# Patient Record
Sex: Male | Born: 1980
Health system: Southern US, Community
[De-identification: ages and names within clinical notes are randomized; demographics above are authoritative.]

## PROBLEM LIST (undated history)

## (undated) DIAGNOSIS — I10 Essential (primary) hypertension: Secondary | ICD-10-CM

## (undated) DIAGNOSIS — E785 Hyperlipidemia, unspecified: Secondary | ICD-10-CM

## (undated) DIAGNOSIS — R7989 Other specified abnormal findings of blood chemistry: Secondary | ICD-10-CM

## (undated) DIAGNOSIS — K429 Umbilical hernia without obstruction or gangrene: Secondary | ICD-10-CM

## (undated) DIAGNOSIS — E8881 Metabolic syndrome: Secondary | ICD-10-CM

## (undated) DIAGNOSIS — E119 Type 2 diabetes mellitus without complications: Secondary | ICD-10-CM

## (undated) HISTORY — PX: NO PAST SURGERIES: SHX2092

## (undated) HISTORY — PX: KNEE SURGERY: SHX244

## (undated) HISTORY — DX: Umbilical hernia without obstruction or gangrene: K42.9

## (undated) HISTORY — DX: Hyperlipidemia, unspecified: E78.5

## (undated) HISTORY — DX: Other specified abnormal findings of blood chemistry: R79.89

## (undated) HISTORY — DX: Metabolic syndrome: E88.81

## (undated) HISTORY — DX: Essential (primary) hypertension: I10

## (undated) HISTORY — DX: Type 2 diabetes mellitus without complications: E11.9

---

## 1999-06-09 ENCOUNTER — Encounter: Payer: Self-pay | Admitting: Family Medicine

## 1999-06-09 ENCOUNTER — Encounter: Admission: RE | Admit: 1999-06-09 | Discharge: 1999-06-09 | Payer: Self-pay | Admitting: Family Medicine

## 2010-05-30 ENCOUNTER — Emergency Department (HOSPITAL_COMMUNITY)
Admission: EM | Admit: 2010-05-30 | Discharge: 2010-05-30 | Payer: Self-pay | Source: Home / Self Care | Admitting: Emergency Medicine

## 2010-05-30 LAB — POCT I-STAT, CHEM 8
BUN: 10 mg/dL (ref 6–23)
Calcium, Ion: 1.05 mmol/L — ABNORMAL LOW (ref 1.12–1.32)
Chloride: 107 mEq/L (ref 96–112)
Creatinine, Ser: 1.3 mg/dL (ref 0.4–1.5)
Glucose, Bld: 117 mg/dL — ABNORMAL HIGH (ref 70–99)
HCT: 52 % (ref 39.0–52.0)
Hemoglobin: 17.7 g/dL — ABNORMAL HIGH (ref 13.0–17.0)
Potassium: 4.3 mEq/L (ref 3.5–5.1)
Sodium: 143 mEq/L (ref 135–145)
TCO2: 28 mmol/L (ref 0–100)

## 2010-07-01 DIAGNOSIS — Z23 Encounter for immunization: Secondary | ICD-10-CM

## 2010-07-01 DIAGNOSIS — Z0000 Encounter for general adult medical examination without abnormal findings: Secondary | ICD-10-CM

## 2010-07-01 DIAGNOSIS — I10 Essential (primary) hypertension: Secondary | ICD-10-CM

## 2010-07-22 ENCOUNTER — Encounter: Payer: Self-pay | Admitting: Internal Medicine

## 2010-08-05 ENCOUNTER — Ambulatory Visit (INDEPENDENT_AMBULATORY_CARE_PROVIDER_SITE_OTHER): Payer: BC Managed Care – HMO | Admitting: Internal Medicine

## 2010-08-05 DIAGNOSIS — I10 Essential (primary) hypertension: Secondary | ICD-10-CM

## 2010-09-10 ENCOUNTER — Other Ambulatory Visit: Payer: BC Managed Care – HMO | Admitting: Internal Medicine

## 2010-09-10 ENCOUNTER — Other Ambulatory Visit: Payer: BC Managed Care – HMO

## 2010-09-10 DIAGNOSIS — I10 Essential (primary) hypertension: Secondary | ICD-10-CM

## 2010-09-10 LAB — BASIC METABOLIC PANEL
BUN: 15 mg/dL (ref 6–23)
CO2: 23 mEq/L (ref 19–32)
Calcium: 9.2 mg/dL (ref 8.4–10.5)
Chloride: 104 mEq/L (ref 96–112)
Creat: 1.11 mg/dL (ref 0.40–1.50)
Glucose, Bld: 91 mg/dL (ref 70–99)
Potassium: 3.8 mEq/L (ref 3.5–5.3)
Sodium: 140 mEq/L (ref 135–145)

## 2011-02-08 ENCOUNTER — Ambulatory Visit: Payer: BC Managed Care – HMO | Admitting: Internal Medicine

## 2011-03-10 ENCOUNTER — Encounter: Payer: Self-pay | Admitting: Internal Medicine

## 2011-03-10 ENCOUNTER — Other Ambulatory Visit: Payer: Self-pay | Admitting: Internal Medicine

## 2011-03-10 ENCOUNTER — Ambulatory Visit (INDEPENDENT_AMBULATORY_CARE_PROVIDER_SITE_OTHER): Payer: BC Managed Care – HMO | Admitting: Internal Medicine

## 2011-03-10 VITALS — BP 136/90 | HR 68 | Temp 97.8°F | Ht 68.0 in | Wt 254.0 lb

## 2011-03-10 DIAGNOSIS — Z23 Encounter for immunization: Secondary | ICD-10-CM

## 2011-03-10 DIAGNOSIS — I10 Essential (primary) hypertension: Secondary | ICD-10-CM

## 2011-03-10 DIAGNOSIS — E785 Hyperlipidemia, unspecified: Secondary | ICD-10-CM

## 2011-03-10 HISTORY — DX: Essential (primary) hypertension: I10

## 2011-03-10 LAB — BASIC METABOLIC PANEL
BUN: 10 mg/dL (ref 6–23)
CO2: 27 mEq/L (ref 19–32)
Calcium: 9 mg/dL (ref 8.4–10.5)
Chloride: 104 mEq/L (ref 96–112)
Creat: 1.12 mg/dL (ref 0.50–1.35)
Glucose, Bld: 99 mg/dL (ref 70–99)
Potassium: 3.9 mEq/L (ref 3.5–5.3)
Sodium: 140 mEq/L (ref 135–145)

## 2011-03-10 LAB — LIPID PANEL
Cholesterol: 240 mg/dL — ABNORMAL HIGH (ref 0–200)
HDL: 38 mg/dL — ABNORMAL LOW (ref >39)
LDL Cholesterol: 189 mg/dL — ABNORMAL HIGH (ref 0–99)
Total CHOL/HDL Ratio: 6.3 Ratio
Triglycerides: 67 mg/dL (ref <150)
VLDL: 13 mg/dL (ref 0–40)

## 2011-03-10 NOTE — Patient Instructions (Addendum)
Add amlodipine to lisinopril 20/25 daily for hypertension control. Return in 4 weeks. Influenza immunization given today.

## 2011-03-10 NOTE — Progress Notes (Signed)
  Subjective:    Patient ID: Charles Higgins, male    DOB: [birth year], 30 y.o.   MRN: [id number]  HPI 30 year old black male with family history of hypertension currently on lisinopril 20/25 daily. Here today for six-month followup. Blood pressure is elevated at 140/90 with large cuff. Has not been checking blood pressure. Is somewhat overweight. Is compliant with blood pressure medication.    Review of Systems     Objective:   Physical Exam neck supple without JVD or thyromegaly; chest clear; cardiac exam regular rate and rhythm; extremities without edema. Basic metabolic panel drawn and is pending.        Assessment & Plan:  Hypertension  Obesity  Plan: Add amlodipine 5 mg daily 2 lisinopril 20/25 daily. Return in 4 weeks. Influenza immunization given today. Is expecting newborn child in approximately 22 weeks.

## 2011-03-11 ENCOUNTER — Other Ambulatory Visit: Payer: Self-pay

## 2011-03-11 MED ORDER — SIMVASTATIN 20 MG PO TABS: 20.0000 mg | ORAL_TABLET | Freq: Every evening | ORAL | Status: DC

## 2011-03-15 ENCOUNTER — Other Ambulatory Visit: Payer: Self-pay

## 2011-03-15 MED ORDER — LISINOPRIL-HYDROCHLOROTHIAZIDE 20-25 MG PO TABS: 1.0000 | ORAL_TABLET | Freq: Every day | ORAL | Status: DC

## 2011-04-12 ENCOUNTER — Ambulatory Visit: Payer: BC Managed Care – HMO | Admitting: Internal Medicine

## 2011-04-21 ENCOUNTER — Ambulatory Visit (INDEPENDENT_AMBULATORY_CARE_PROVIDER_SITE_OTHER): Payer: BC Managed Care – HMO | Admitting: Internal Medicine

## 2011-04-21 ENCOUNTER — Encounter: Payer: Self-pay | Admitting: Internal Medicine

## 2011-04-21 VITALS — BP 132/84 | HR 76 | Temp 98.1°F | Wt 251.0 lb

## 2011-04-21 DIAGNOSIS — I10 Essential (primary) hypertension: Secondary | ICD-10-CM

## 2011-04-21 NOTE — Patient Instructions (Signed)
You have been given prescription for home blood pressure monitor. Please check her blood pressure at home 2 or 3 times a week. Please advise if blood pressure is not well controlled i.e. staying 120-134 over no higher than 84 diastolically. Please try the diet exercise and lose some weight. Return in 6 months. Continue same medications for now.

## 2011-04-21 NOTE — Progress Notes (Signed)
  Subjective:    Patient ID: Charles Higgins, male    DOB: [birth year], 30 y.o.   MRN: [id number]  HPI 30 year old black male with hypertension. Last visit we added amlodipine 5 mg daily to lisinopril 20/25 daily. He had influenza immunization at last visit. No complaints or problems on the medication. No side effects. Expecting first born child April 2013 which is been determined to be a boy.    Review of Systems     Objective:   Physical Exam blood pressure recheck today with large is 120/88. Chest clear to auscultation; cardiac exam regular rate and rhythm; extremities without edema        Assessment & Plan:  Hypertension  Plan: Patient will consider purchasing a home blood pressure monitor and monitor his blood pressure at home 2 or 3 times a week. Will advise if his blood pressure is elevated higher than 134 systolically or 84 diastolically on a consistent basis. Otherwise continue same medication return in 6 months. Aiming to have systolic blood pressure 120 to 130 most of the time.

## 2011-06-23 ENCOUNTER — Ambulatory Visit (INDEPENDENT_AMBULATORY_CARE_PROVIDER_SITE_OTHER): Payer: BC Managed Care – HMO | Admitting: Internal Medicine

## 2011-06-23 ENCOUNTER — Ambulatory Visit: Payer: BC Managed Care – HMO | Admitting: Internal Medicine

## 2011-06-23 ENCOUNTER — Encounter: Payer: Self-pay | Admitting: Internal Medicine

## 2011-06-23 DIAGNOSIS — K429 Umbilical hernia without obstruction or gangrene: Secondary | ICD-10-CM

## 2011-06-23 DIAGNOSIS — E785 Hyperlipidemia, unspecified: Secondary | ICD-10-CM | POA: Insufficient documentation

## 2011-06-23 DIAGNOSIS — I10 Essential (primary) hypertension: Secondary | ICD-10-CM

## 2011-06-23 HISTORY — DX: Umbilical hernia without obstruction or gangrene: K42.9

## 2011-06-23 HISTORY — DX: Hyperlipidemia, unspecified: E78.5

## 2011-06-23 NOTE — Progress Notes (Signed)
  Subjective:    Patient ID: Charles Higgins, male    DOB: August 25, 1980, 31 y.o.   MRN: [id number]  HPI patient in today concerned about area in umbilicus it he just noted about 2 weeks ago in the shower. Noticed a bald in the umbilicus. Doesn't recall any heavy lifting or exertion. First child is coming in April and he is excited about that. Unfortunately his blood pressure is not well controlled today. He's been out of his medications for 2 weeks because of financial reasons. We called [facility] try to clarify the cost of some of his medications. He's also not been taking Zocor generic either. History of hypertension and hyperlipidemia. Says he's able to purchase medications tomorrow.    Review of Systems     Objective:   Physical Exam  umbilicus has small herniation lateral aspect. It is reducible.        Assessment & Plan:  Umbilical hernia  Hypertension-poorly controlled at present off medication  Hyperlipidemia  Plan: Spent some time speaking with patient about cause of umbilical hernia, complications of strangulation, et Karie Soda. He does not have to have this fixed at the present time but may want to consider having it fixed down the road in the future. He was reassured this is not a serious problem and it is a common problem. I am concerned he is not on his antihypertensive medications. He assures me he will start taking that tomorrow. BP was 132/84 in December. Needs to take BP meds daily.

## 2011-06-23 NOTE — Patient Instructions (Signed)
Please restart her antihypertensive medications and your lipid-lowering medicine. Please be sure that the umbilical hernia is not an urgent situation. He can be repaired at your convenience. Please watch for signs of possible strangulation of the umbilical hernia. Avoid heavy lifting.

## 2011-10-25 ENCOUNTER — Ambulatory Visit: Payer: BC Managed Care – HMO | Admitting: Internal Medicine

## 2011-11-28 ENCOUNTER — Other Ambulatory Visit: Payer: Self-pay | Admitting: Internal Medicine

## 2012-07-15 ENCOUNTER — Other Ambulatory Visit: Payer: Self-pay | Admitting: Internal Medicine

## 2012-07-15 NOTE — Telephone Encounter (Signed)
Does pt need appt.

## 2012-07-16 ENCOUNTER — Other Ambulatory Visit: Payer: Self-pay

## 2012-07-16 NOTE — Telephone Encounter (Signed)
Patient last seen 06/23/2011. Cancelled 2 appointments after that

## 2013-07-14 ENCOUNTER — Other Ambulatory Visit: Payer: Self-pay | Admitting: Internal Medicine

## 2013-07-14 NOTE — Telephone Encounter (Signed)
May need appt

## 2013-07-15 NOTE — Telephone Encounter (Signed)
Patient admits to being out of medicine for about 6 months. Has not been seen here in 2 years. Appointment made for Thursday 07/18/2013. meds filled for 30 days only.

## 2013-07-16 ENCOUNTER — Other Ambulatory Visit: Payer: Self-pay | Admitting: Internal Medicine

## 2013-07-18 ENCOUNTER — Ambulatory Visit (INDEPENDENT_AMBULATORY_CARE_PROVIDER_SITE_OTHER): Payer: BC Managed Care – PPO | Admitting: Internal Medicine

## 2013-07-18 ENCOUNTER — Encounter: Payer: Self-pay | Admitting: Internal Medicine

## 2013-07-18 VITALS — BP 180/130 | HR 84 | Temp 98.3°F | Wt 263.0 lb

## 2013-07-18 DIAGNOSIS — E119 Type 2 diabetes mellitus without complications: Secondary | ICD-10-CM

## 2013-07-18 DIAGNOSIS — I10 Essential (primary) hypertension: Secondary | ICD-10-CM

## 2013-07-18 DIAGNOSIS — E669 Obesity, unspecified: Secondary | ICD-10-CM

## 2013-07-18 LAB — POCT URINALYSIS DIPSTICK
BILIRUBIN UA: NEGATIVE
Blood, UA: NEGATIVE
GLUCOSE UA: NEGATIVE
Ketones, UA: NEGATIVE
Leukocytes, UA: NEGATIVE
Nitrite, UA: NEGATIVE
Urobilinogen, UA: NEGATIVE
pH, UA: 6

## 2013-07-18 LAB — HEMOGLOBIN A1C
Hgb A1c MFr Bld: 6.4 % — ABNORMAL HIGH (ref <5.7)
MEAN PLASMA GLUCOSE: 137 mg/dL — AB (ref <117)

## 2013-07-18 MED ORDER — AMLODIPINE BESYLATE 5 MG PO TABS: ORAL_TABLET | ORAL | Status: DC

## 2013-07-18 MED ORDER — LISINOPRIL-HYDROCHLOROTHIAZIDE 20-25 MG PO TABS: ORAL_TABLET | ORAL | Status: DC

## 2013-07-18 NOTE — Patient Instructions (Signed)
Restart blood pressure meds and return in 10 days

## 2013-07-19 ENCOUNTER — Other Ambulatory Visit: Payer: Self-pay

## 2013-07-19 DIAGNOSIS — E119 Type 2 diabetes mellitus without complications: Secondary | ICD-10-CM

## 2013-07-19 LAB — BASIC METABOLIC PANEL
BUN: 14 mg/dL (ref 6–23)
CHLORIDE: 102 meq/L (ref 96–112)
CO2: 30 meq/L (ref 19–32)
Calcium: 8.8 mg/dL (ref 8.4–10.5)
Creat: 1.35 mg/dL (ref 0.50–1.35)
Glucose, Bld: 88 mg/dL (ref 70–99)
POTASSIUM: 3.6 meq/L (ref 3.5–5.3)
SODIUM: 141 meq/L (ref 135–145)

## 2013-07-19 MED ORDER — METFORMIN HCL 500 MG PO TABS: 500.0000 mg | ORAL_TABLET | Freq: Two times a day (BID) | ORAL | Status: DC

## 2013-07-19 NOTE — Progress Notes (Signed)
Patient informed. 

## 2013-07-29 ENCOUNTER — Ambulatory Visit (INDEPENDENT_AMBULATORY_CARE_PROVIDER_SITE_OTHER): Payer: BC Managed Care – HMO | Admitting: Internal Medicine

## 2013-07-29 ENCOUNTER — Encounter: Payer: Self-pay | Admitting: Internal Medicine

## 2013-07-29 VITALS — BP 150/110 | HR 92 | Temp 98.2°F | Wt 260.0 lb

## 2013-07-29 DIAGNOSIS — E119 Type 2 diabetes mellitus without complications: Secondary | ICD-10-CM

## 2013-07-29 DIAGNOSIS — I10 Essential (primary) hypertension: Secondary | ICD-10-CM

## 2013-07-29 MED ORDER — LISINOPRIL 20 MG PO TABS: 20.0000 mg | ORAL_TABLET | Freq: Every day | ORAL | Status: DC

## 2013-07-29 MED ORDER — FUROSEMIDE 40 MG PO TABS: 40.0000 mg | ORAL_TABLET | Freq: Every day | ORAL | Status: DC

## 2013-07-29 NOTE — Patient Instructions (Signed)
Take lisinopril 20 mg daily, take Lasix 40 mg daily, take Accu-Cheks twice daily before breakfast and supper. Continue amlodipine 5 mg daily. Follow 1800-calorie ADA diet. Keep track of her blood sugars. Return in 2 weeks.

## 2013-07-29 NOTE — Progress Notes (Signed)
   Subjective:    Patient ID: Charles Higgins, male    DOB: 07/01/1980, 33 y.o.   MRN: [id number]  HPI    He  has been taking blood pressure medication lisinopril/HCTZ 20/25 daily in addition to amlodipine 5 mg daily. Blood pressure is a bit better but not a lot better. We also have learned that he now has diabetes mellitus. His father has history of diabetes and is on dialysis. Spoke with him at length about 1800-calorie diet, foods it would be acceptable and discussion of portions. He needs to get some exercise on a daily basis. I think will let him try diet for few weeks and recheck his hemoglobin A1c in 6 weeks. Do not think lisinopril HCTZ is working for him.    Review of Systems     Objective:   Physical Exam  Recheck blood pressure in right arm. Left arm was checked the first time. Blood pressure in right arm is 160/100. Chest clear. Cardiac exam regular rate and rhythm. Extremities without edema.      Assessment & Plan:  Poorly controlled hypertension  Serum creatinine at upper limits of normal  Diabetes mellitus  Plan: He is to return here in 2 weeks. To discontinue lisinopril HCTZ. He will continue lisinopril 20 mg daily and start Lasix 40 mg daily. He will continue amlodipine 5 mg daily. He'll purchase diabetic test strips to use twice daily before breakfast and supper and bring readings at next visit.

## 2013-08-16 ENCOUNTER — Ambulatory Visit: Payer: BC Managed Care – HMO | Admitting: Internal Medicine

## 2013-08-23 ENCOUNTER — Ambulatory Visit (INDEPENDENT_AMBULATORY_CARE_PROVIDER_SITE_OTHER): Payer: BC Managed Care – PPO | Admitting: Internal Medicine

## 2013-08-23 ENCOUNTER — Encounter: Payer: Self-pay | Admitting: Internal Medicine

## 2013-08-23 VITALS — BP 130/100 | HR 80 | Temp 98.0°F | Wt 253.0 lb

## 2013-08-23 DIAGNOSIS — E119 Type 2 diabetes mellitus without complications: Secondary | ICD-10-CM

## 2013-08-23 DIAGNOSIS — I10 Essential (primary) hypertension: Secondary | ICD-10-CM

## 2013-08-23 DIAGNOSIS — R7989 Other specified abnormal findings of blood chemistry: Secondary | ICD-10-CM

## 2013-08-23 DIAGNOSIS — R799 Abnormal finding of blood chemistry, unspecified: Secondary | ICD-10-CM

## 2013-08-23 LAB — GLUCOSE, POCT (MANUAL RESULT ENTRY)

## 2013-08-23 MED ORDER — AMLODIPINE BESYLATE 10 MG PO TABS: ORAL_TABLET | ORAL | Status: DC

## 2013-08-23 NOTE — Patient Instructions (Addendum)
Return in one week. Increase amlodipine to 10 mg daily. Continue metformin.

## 2013-08-24 LAB — BASIC METABOLIC PANEL
BUN: 11 mg/dL (ref 6–23)
CHLORIDE: 101 meq/L (ref 96–112)
CO2: 27 meq/L (ref 19–32)
CREATININE: 1.1 mg/dL (ref 0.50–1.35)
Calcium: 8.9 mg/dL (ref 8.4–10.5)
GLUCOSE: 76 mg/dL (ref 70–99)
Potassium: 3.8 mEq/L (ref 3.5–5.3)
Sodium: 141 mEq/L (ref 135–145)

## 2013-08-25 DIAGNOSIS — R7989 Other specified abnormal findings of blood chemistry: Secondary | ICD-10-CM

## 2013-08-25 DIAGNOSIS — E119 Type 2 diabetes mellitus without complications: Secondary | ICD-10-CM | POA: Insufficient documentation

## 2013-08-25 HISTORY — DX: Type 2 diabetes mellitus without complications: E11.9

## 2013-08-25 HISTORY — DX: Other specified abnormal findings of blood chemistry: R79.89

## 2013-08-25 NOTE — Progress Notes (Signed)
   Subjective:    Patient ID: Charles Higgins, male    DOB: 02/18/1981, 33 y.o.   MRN: [id number]  HPI Blood pressure remains elevated. Says he has been taking medications as directed. Unfortunately did not purchase home glucose monitor because he thought it was very expensive. Has lost prescription. New prescription given for home glucose monitoring test strips today. Says he's make considerable modifications in his diet. Has lost a few pounds. Basic metabolic panel drawn today. He is on Lasix 40 mg daily, lisinopril 20 mg daily, amlodipine 5 mg daily.    Review of Systems     Objective:   Physical Exam chest clear to auscultation. Cardiac exam regular rate and rhythm. Extremities without edema.        Assessment & Plan:  Poorly controlled hypertension-see below  Type 2 diabetes mellitus-purchase diabetic test strips and home glucose monitor. Continue metformin 500 mg twice daily  Accu-Chek today is 89.  Plan: He is to return in one week for office visit blood pressure check. Finances are a problem for him. Increase amlodipine to 10 mg daily. Continue Lasix and lisinopril.

## 2013-08-27 ENCOUNTER — Ambulatory Visit: Payer: BC Managed Care – HMO

## 2013-08-30 ENCOUNTER — Ambulatory Visit: Payer: BC Managed Care – PPO | Admitting: Internal Medicine

## 2013-08-30 ENCOUNTER — Encounter: Payer: Self-pay | Admitting: Internal Medicine

## 2013-08-30 VITALS — BP 154/104 | HR 76 | Temp 97.7°F | Wt 253.0 lb

## 2013-08-30 DIAGNOSIS — E119 Type 2 diabetes mellitus without complications: Secondary | ICD-10-CM

## 2013-08-30 DIAGNOSIS — I10 Essential (primary) hypertension: Secondary | ICD-10-CM

## 2013-08-30 MED ORDER — LOSARTAN POTASSIUM 100 MG PO TABS: 100.0000 mg | ORAL_TABLET | Freq: Every day | ORAL | Status: DC

## 2013-08-30 NOTE — Progress Notes (Signed)
   Subjective:    Patient ID: Charles Higgins, male    DOB: [birth year], 33 y.o.   MRN: [id number]  HPI In today for recheck of hypertension. Denies noncompliance with medication. He is on lisinopril 20 mg daily, Lasix 40 mg daily, amlodipine has been increased to 10 mg daily. He has no edema on increased dose of amlodipine. Has not purchased Accu-Chek machine yet because he could not afford it. He just got paid today.    Review of Systems     Objective:   Physical Exam Skin is warm and dry. Chest clear to auscultation. Cardiac exam regular rate and rhythm. Extremities without edema       Assessment & Plan:  Difficult to control hypertension currently on 3 drug regimen  New onset diabetes mellitus currently being controlled by diet that is yet purchase Accu-Chek she  Plan: Discontinue lisinopril and substitute losartan 100 mg daily, continue Lasix 40 mg daily, continue amlodipine 10 mg daily. Call me with blood pressure readings from pharmacy in one week, otherwise return here in 2 weeks. Consider adding Bystolic.

## 2013-08-30 NOTE — Patient Instructions (Signed)
Please purchase Accu-Chek machine and check Accu-Cheks at least once daily. Check blood pressure next week at pharmacy and return here in 2 weeks. Call me with blood pressure results next week. We are substituting losartan for lisinopril.

## 2013-09-03 ENCOUNTER — Ambulatory Visit: Payer: BC Managed Care – HMO

## 2013-09-13 ENCOUNTER — Ambulatory Visit: Payer: BC Managed Care – PPO | Admitting: Internal Medicine

## 2013-09-19 ENCOUNTER — Encounter: Payer: Self-pay | Admitting: Internal Medicine

## 2013-09-19 ENCOUNTER — Ambulatory Visit (INDEPENDENT_AMBULATORY_CARE_PROVIDER_SITE_OTHER): Payer: BC Managed Care – PPO | Admitting: Internal Medicine

## 2013-09-19 VITALS — BP 140/98 | HR 76 | Temp 97.6°F | Wt 259.0 lb

## 2013-09-19 DIAGNOSIS — I10 Essential (primary) hypertension: Secondary | ICD-10-CM

## 2013-09-19 DIAGNOSIS — E119 Type 2 diabetes mellitus without complications: Secondary | ICD-10-CM

## 2013-09-19 LAB — BASIC METABOLIC PANEL
BUN: 10 mg/dL (ref 6–23)
CALCIUM: 9.1 mg/dL (ref 8.4–10.5)
CO2: 29 mEq/L (ref 19–32)
Chloride: 102 mEq/L (ref 96–112)
Creat: 1.11 mg/dL (ref 0.50–1.35)
Glucose, Bld: 98 mg/dL (ref 70–99)
POTASSIUM: 3.8 meq/L (ref 3.5–5.3)
Sodium: 140 mEq/L (ref 135–145)

## 2013-09-19 MED ORDER — LABETALOL HCL 200 MG PO TABS: 200.0000 mg | ORAL_TABLET | Freq: Two times a day (BID) | ORAL | Status: DC

## 2013-09-19 NOTE — Progress Notes (Signed)
   Subjective:    Patient ID: Charles Higgins, male    DOB: Jun 25, 1980, 33 y.o.   MRN: [id number]  HPI In today to followup on diabetes and hypertension. Says that his Accu-Cheks have been running no higher than 92. Hemoglobin A1c drawn today. Basic metabolic panel also drawn since she's now on Lasix and losartan 100 mg daily. He's also on amlodipine 10 mg daily. He is not having dependent edema with amlodipine. Says he is watching his diet. I don't think is getting much exercise.  Urine sent for microalbumin today.  I had placed him on metformin previously but he is not taking it.  Review of Systems     Objective:   Physical Exam  Spent 10 minutes speaking with patient about these issues. Blood pressure still not well controlled despite 3 drug regimen.      Assessment & Plan:  Hypertension-difficult to control  Type 2 diabetes mellitus  Plan: Add Normodyne 200 mg twice daily to Lasix 40 mg daily, losartan 100 mg daily, amlodipine 10 mg daily. Return in 2 weeks. Says currently medications are only running him about $8 a month.

## 2013-09-19 NOTE — Patient Instructions (Signed)
Add Normodyne 200 mg twice a day to amlodipine, losartan and lasix . Return in 2 weeks

## 2013-09-20 LAB — MICROALBUMIN, URINE: Microalb, Ur: 0.5 mg/dL (ref 0.00–1.89)

## 2013-09-20 LAB — HEMOGLOBIN A1C
HEMOGLOBIN A1C: 6 % — AB (ref <5.7)
MEAN PLASMA GLUCOSE: 126 mg/dL — AB (ref <117)

## 2013-09-24 ENCOUNTER — Ambulatory Visit: Payer: BC Managed Care – HMO

## 2013-10-01 ENCOUNTER — Ambulatory Visit: Payer: BC Managed Care – HMO

## 2013-10-04 ENCOUNTER — Ambulatory Visit (INDEPENDENT_AMBULATORY_CARE_PROVIDER_SITE_OTHER): Payer: Self-pay | Admitting: Internal Medicine

## 2013-10-04 ENCOUNTER — Encounter: Payer: Self-pay | Admitting: Internal Medicine

## 2013-10-04 VITALS — BP 130/100 | HR 80 | Resp 12

## 2013-10-04 DIAGNOSIS — I10 Essential (primary) hypertension: Secondary | ICD-10-CM

## 2013-10-04 MED ORDER — LABETALOL HCL 300 MG PO TABS: 300.0000 mg | ORAL_TABLET | Freq: Two times a day (BID) | ORAL | Status: DC

## 2013-10-04 NOTE — Patient Instructions (Addendum)
Increasing Normodyne to 300 mg twice a day. Continue Losartan, Lasix and amlodipine. Return in 5 weeks.

## 2013-10-05 ENCOUNTER — Encounter: Payer: Self-pay | Admitting: Internal Medicine

## 2013-10-05 NOTE — Progress Notes (Signed)
   Subjective:    Patient ID: Charles Higgins, male    DOB: [birth year], 33 y.o.   MRN: [id number]  HPI He is here today to followup on hypertension. He is on losartan 100 mg daily, amlodipine 10 mg daily, Lasix 40 mg daily. We also started at last visit labetalol 200 mg twice a day which he has tolerated just fine.      Review of Systems     Objective:   Physical Exam  Chest clear to auscultation. Cardiac exam regular rate and rhythm. Extremities without edema.      Assessment & Plan:  Difficult to control hypertension  Type 2 diabetes mellitus-under much better control with diet. Please see recent hemoglobin A1c  Plan: Increase labetalol to 300 mg twice daily, continued losartan 100 mg daily, amlodipine 10 mg daily, Lasix 40 mg daily. Return in 5 weeks at which time he'll need basic metabolic panel.

## 2013-10-08 ENCOUNTER — Ambulatory Visit: Payer: BC Managed Care – HMO

## 2013-11-08 ENCOUNTER — Ambulatory Visit: Payer: BC Managed Care – PPO | Admitting: Internal Medicine

## 2013-11-18 ENCOUNTER — Ambulatory Visit (INDEPENDENT_AMBULATORY_CARE_PROVIDER_SITE_OTHER): Payer: BC Managed Care – PPO | Admitting: Internal Medicine

## 2013-11-18 ENCOUNTER — Encounter: Payer: Self-pay | Admitting: Internal Medicine

## 2013-11-18 VITALS — BP 130/84 | HR 72 | Wt 253.0 lb

## 2013-11-18 DIAGNOSIS — E119 Type 2 diabetes mellitus without complications: Secondary | ICD-10-CM

## 2013-11-18 DIAGNOSIS — I10 Essential (primary) hypertension: Secondary | ICD-10-CM

## 2013-12-06 ENCOUNTER — Encounter: Payer: Self-pay | Admitting: Internal Medicine

## 2013-12-06 ENCOUNTER — Ambulatory Visit (INDEPENDENT_AMBULATORY_CARE_PROVIDER_SITE_OTHER): Payer: BC Managed Care – PPO | Admitting: Internal Medicine

## 2013-12-06 VITALS — BP 138/92 | HR 76 | Wt 259.0 lb

## 2013-12-06 DIAGNOSIS — R7989 Other specified abnormal findings of blood chemistry: Secondary | ICD-10-CM

## 2013-12-06 DIAGNOSIS — R799 Abnormal finding of blood chemistry, unspecified: Secondary | ICD-10-CM

## 2013-12-06 DIAGNOSIS — E8881 Metabolic syndrome: Secondary | ICD-10-CM

## 2013-12-06 DIAGNOSIS — E785 Hyperlipidemia, unspecified: Secondary | ICD-10-CM

## 2013-12-06 DIAGNOSIS — I10 Essential (primary) hypertension: Secondary | ICD-10-CM

## 2013-12-06 DIAGNOSIS — E669 Obesity, unspecified: Secondary | ICD-10-CM

## 2013-12-06 DIAGNOSIS — E119 Type 2 diabetes mellitus without complications: Secondary | ICD-10-CM

## 2013-12-06 MED ORDER — LOSARTAN POTASSIUM 100 MG PO TABS: 100.0000 mg | ORAL_TABLET | Freq: Every day | ORAL | Status: DC

## 2013-12-06 MED ORDER — LABETALOL HCL 300 MG PO TABS: 300.0000 mg | ORAL_TABLET | Freq: Two times a day (BID) | ORAL | Status: DC

## 2013-12-06 MED ORDER — FUROSEMIDE 40 MG PO TABS: 40.0000 mg | ORAL_TABLET | Freq: Every day | ORAL | Status: DC

## 2013-12-06 NOTE — Patient Instructions (Addendum)
Diet exercise and weight loss. Return in 3 months for office visit, needs fasting lipid panel. basic metabolic panel and hemoglobin [id number]. Call with Accu-Cheks next week

## 2013-12-07 LAB — BASIC METABOLIC PANEL
BUN: 9 mg/dL (ref 6–23)
CO2: 32 mEq/L (ref 19–32)
Calcium: 9.2 mg/dL (ref 8.4–10.5)
Chloride: 102 mEq/L (ref 96–112)
Creat: 1.34 mg/dL (ref 0.50–1.35)
Glucose, Bld: 87 mg/dL (ref 70–99)
Potassium: 3.9 mEq/L (ref 3.5–5.3)
Sodium: 140 mEq/L (ref 135–145)

## 2013-12-29 NOTE — Progress Notes (Signed)
   Subjective:    Patient ID: Charles Higgins, male    DOB: 09-Nov-1980, 33 y.o.   MRN: [id number]  HPI For followup of diabetes mellitus and hypertension. History of elevated serum creatinine. Blood pressure today is 130/90 which is the best I have seen it in some time. Not checking Accu-Cheks on her regular basis and needs to get in the habit of doing that. These to continue with diet and exercise efforts. Reports situational stress. Mother-in-law has been diagnosed with breast cancer.    Review of Systems     Objective:   Physical Exam  Neck is supple without JVD thyromegaly or carotid bruits. Chest clear to auscultation. Cardiac exam regular rate and rhythm normal S1 and S2. Extremities without edema.      Assessment & Plan:  Hypertension  Elevated serum creatinine  Type 2 diabetes mellitus  Plan: Basic metabolic panel drawn today. Needs to keep Accu-Cheks and call with report next week. Continue diet exercise and weight loss efforts. Return in 3 months for office visit, basic metabolic panel and hemoglobin Q4O.

## 2013-12-29 NOTE — Progress Notes (Signed)
   Subjective:    Patient ID: Charles Higgins, male    DOB: [birth year], 33 y.o.   MRN: [id number]  HPI Patient not seen since 2013. History of hypertension and hyperlipidemia. Stopped taking blood pressure medications. Realized recently blood pressure was elevated. Now here for evaluation. Weight in 2012 was 254 and is now 263. Blood pressure is significantly elevated today although he has no history of headache or visual disturbances.    Review of Systems     Objective:   Physical Exam Skin warm and dry. Nodes none. Funduscopic exam is benign. Neck is supple without JVD thyromegaly or carotid bruits. Chest clear to auscultation. Cardiac exam regular rate and rhythm. Extremities without edema.        Assessment & Plan:  Severe hypertension  Weight gain  Obesity  Plan: Screen for diabetes. Restart blood pressure medications. Obtain be met and hemoglobin A1c. Return in 10 days. Diet and exercise.

## 2014-01-13 ENCOUNTER — Telehealth: Payer: Self-pay | Admitting: Internal Medicine

## 2014-01-13 ENCOUNTER — Other Ambulatory Visit: Payer: Self-pay

## 2014-01-13 MED ORDER — AMLODIPINE BESYLATE 10 MG PO TABS: ORAL_TABLET | ORAL | Status: DC

## 2014-01-13 NOTE — Telephone Encounter (Signed)
Patient called requesting amlodipine refill to [facility].

## 2014-01-13 NOTE — Telephone Encounter (Signed)
Patient called to request refill on Amlodipine.  Verbal order to go ahead and refill; patient had 0 refills left.  Carollee Herter e-scribed Rx to [facility] and [facility].

## 2014-01-19 ENCOUNTER — Encounter: Payer: Self-pay | Admitting: Internal Medicine

## 2014-01-19 NOTE — Progress Notes (Signed)
   Subjective:    Patient ID: Charles Higgins, male    DOB: 1980-07-31, 33 y.o.   MRN: [id number]  HPI  Blood pressure is finally coming under control. After he sits and rests a bit, the reading is much better at 130/84. Says he's trying to diet exercise and lose weight but it somewhat difficult. Mother-in-law has been diagnosed with breast cancer. Son recently had a febrile seizure but is doing well. He is on multiple drug regimen at this point in time for hypertension. He has difficult to control hypertension. Basic metabolic panel drawn today.    Review of Systems     Objective:   Physical Exam  Chest clear to auscultation. Cardiac exam regular rate and rhythm. Extremities without edema      Assessment & Plan:  Hypertension requiring several drug regimen  Controlled diabetes mellitus  Plan: Continue same medications and return in November for office visit, basic metabolic panel, hemoglobin A1c. Encouraged diet exercise and weight loss. He is on metformin for diabetes 500 mg twice daily. He is on Normodyne 300 mg twice daily, amlodipine 10 mg daily, Lasix 40 mg daily, Cozaar 100 mg daily

## 2014-01-19 NOTE — Patient Instructions (Addendum)
Continue same medications and return in 4 months 

## 2014-03-14 ENCOUNTER — Ambulatory Visit: Payer: BC Managed Care – PPO | Admitting: Internal Medicine

## 2014-07-27 ENCOUNTER — Encounter (HOSPITAL_COMMUNITY): Payer: Self-pay | Admitting: Nurse Practitioner

## 2014-07-27 ENCOUNTER — Emergency Department (HOSPITAL_COMMUNITY)
Admission: EM | Admit: 2014-07-27 | Discharge: 2014-07-27 | Disposition: A | Discharge status: Home / Self Care | Payer: BLUE CROSS/BLUE SHIELD | Attending: Emergency Medicine | Admitting: Emergency Medicine

## 2014-07-27 DIAGNOSIS — S39012A Strain of muscle, fascia and tendon of lower back, initial encounter: Secondary | ICD-10-CM | POA: Insufficient documentation

## 2014-07-27 DIAGNOSIS — Y999 Unspecified external cause status: Secondary | ICD-10-CM | POA: Diagnosis not present

## 2014-07-27 DIAGNOSIS — I10 Essential (primary) hypertension: Secondary | ICD-10-CM | POA: Insufficient documentation

## 2014-07-27 DIAGNOSIS — Y929 Unspecified place or not applicable: Secondary | ICD-10-CM | POA: Diagnosis not present

## 2014-07-27 DIAGNOSIS — Y939 Activity, unspecified: Secondary | ICD-10-CM | POA: Insufficient documentation

## 2014-07-27 DIAGNOSIS — Z79899 Other long term (current) drug therapy: Secondary | ICD-10-CM | POA: Diagnosis not present

## 2014-07-27 DIAGNOSIS — S3992XA Unspecified injury of lower back, initial encounter: Secondary | ICD-10-CM | POA: Diagnosis present

## 2014-07-27 DIAGNOSIS — X58XXXA Exposure to other specified factors, initial encounter: Secondary | ICD-10-CM | POA: Diagnosis not present

## 2014-07-27 LAB — URINALYSIS, ROUTINE W REFLEX MICROSCOPIC
BILIRUBIN URINE: NEGATIVE
Glucose, UA: NEGATIVE mg/dL
HGB URINE DIPSTICK: NEGATIVE
Ketones, ur: NEGATIVE mg/dL
LEUKOCYTES UA: NEGATIVE
Nitrite: NEGATIVE
PROTEIN: NEGATIVE mg/dL
Specific Gravity, Urine: 1.035 — ABNORMAL HIGH (ref 1.005–1.030)
UROBILINOGEN UA: 0.2 mg/dL (ref 0.0–1.0)
pH: 5.5 (ref 5.0–8.0)

## 2014-07-27 MED ORDER — HYDROCODONE-ACETAMINOPHEN 5-325 MG PO TABS
2.0000 | ORAL_TABLET | ORAL | Status: DC | PRN
Start: 2014-07-27 — End: 2016-06-06

## 2014-07-27 MED ORDER — IBUPROFEN 800 MG PO TABS: 800.0000 mg | ORAL_TABLET | Freq: Three times a day (TID) | ORAL | Status: DC

## 2014-07-27 MED ORDER — METHOCARBAMOL 500 MG PO TABS: 500.0000 mg | ORAL_TABLET | Freq: Two times a day (BID) | ORAL | Status: DC

## 2014-07-27 MED ORDER — OXYCODONE-ACETAMINOPHEN 5-325 MG PO TABS
2.0000 | ORAL_TABLET | Freq: Once | ORAL | Status: AC
  Administered 2014-07-27: 2 via ORAL
  Filled 2014-07-27: qty 2

## 2014-07-27 NOTE — Discharge Instructions (Signed)
Back Pain, Adult Low back pain is very common. About 1 in 5 people have back pain.The cause of low back pain is rarely dangerous. The pain often gets better over time.About half of people with a sudden onset of back pain feel better in just 2 weeks. About 8 in 10 people feel better by 6 weeks.  CAUSES Some common causes of back pain include:  Strain of the muscles or ligaments supporting the spine.  Wear and tear (degeneration) of the spinal discs.  Arthritis.  Direct injury to the back. DIAGNOSIS Most of the time, the direct cause of low back pain is not known.However, back pain can be treated effectively even when the exact cause of the pain is unknown.Answering your caregiver's questions about your overall health and symptoms is one of the most accurate ways to make sure the cause of your pain is not dangerous. If your caregiver needs more information, he or she may order lab work or imaging tests (X-rays or MRIs).However, even if imaging tests show changes in your back, this usually does not require surgery. HOME CARE INSTRUCTIONS For many people, back pain returns.Since low back pain is rarely dangerous, it is often a condition that people can learn to manageon their own.   Remain active. It is stressful on the back to sit or stand in one place. Do not sit, drive, or stand in one place for more than 30 minutes at a time. Take short walks on level surfaces as soon as pain allows.Try to increase the length of time you walk each day.  Do not stay in bed.Resting more than 1 or 2 days can delay your recovery.  Do not avoid exercise or work.Your body is made to move.It is not dangerous to be active, even though your back may hurt.Your back will likely heal faster if you return to being active before your pain is gone.  Pay attention to your body when you bend and lift. Many people have less discomfortwhen lifting if they bend their knees, keep the load close to their bodies,and  avoid twisting. Often, the most comfortable positions are those that put less stress on your recovering back.  Find a comfortable position to sleep. Use a firm mattress and lie on your side with your knees slightly bent. If you lie on your back, put a pillow under your knees.  Only take over-the-counter or prescription medicines as directed by your caregiver. Over-the-counter medicines to reduce pain and inflammation are often the most helpful.Your caregiver may prescribe muscle relaxant drugs.These medicines help dull your pain so you can more quickly return to your normal activities and healthy exercise.  Put ice on the injured area.  Put ice in a plastic bag.  Place a towel between your skin and the bag.  Leave the ice on for 15-20 minutes, 03-04 times a day for the first 2 to 3 days. After that, ice and heat may be alternated to reduce pain and spasms.  Ask your caregiver about trying back exercises and gentle massage. This may be of some benefit.  Avoid feeling anxious or stressed.Stress increases muscle tension and can worsen back pain.It is important to recognize when you are anxious or stressed and learn ways to manage it.Exercise is a great option. SEEK MEDICAL CARE IF:  You have pain that is not relieved with rest or medicine.  You have pain that does not improve in 1 week.  You have new symptoms.  You are generally not feeling well. SEEK   IMMEDIATE MEDICAL CARE IF:   You have pain that radiates from your back into your legs.  You develop new bowel or bladder control problems.  You have unusual weakness or numbness in your arms or legs.  You develop nausea or vomiting.  You develop abdominal pain.  You feel faint. Document Released: 04/18/2005 Document Revised: 10/18/2011 Document Reviewed: 08/20/2013 Exit[employer] Patient Information 2015 [employer]. This information is not intended to replace advice given to you by your health care provider. Make sure you  discuss any questions you have with your health care provider.  

## 2014-07-27 NOTE — ED Notes (Signed)
He c/o lower back pain since Wednesday. hes been taking ibuprofen with some relief. Today the pain became much more severe when leaning forward. Describes as sharp, shooting in his lower back. Denies bowel/bladder changes.

## 2014-07-27 NOTE — ED Provider Notes (Signed)
CSN: [id number]     Arrival date & time 07/27/14  1718 History   First MD Initiated Contact with Patient 07/27/14 1743     Chief Complaint  Patient presents with  . Back Pain     (Consider location/radiation/quality/duration/timing/severity/associated sxs/prior Treatment) Patient is a 34 y.o. male presenting with back pain. The history is provided by the patient. No language interpreter was used.  Back Pain Location:  Lumbar spine Quality:  Aching Radiates to:  Does not radiate Pain severity:  Moderate Pain is:  Same all the time Onset quality:  Gradual Duration:  5 days Timing:  Constant Progression:  Worsening Chronicity:  New Context: not recent illness   Relieved by:  Nothing Worsened by:  Nothing tried Ineffective treatments:  None tried Associated symptoms: no abdominal pain   Risk factors: no lack of exercise and not pregnant     Past Medical History  Diagnosis Date  . HTN (hypertension)    History reviewed. No pertinent past surgical history. Family History  Problem Relation Age of Onset  . Diabetes Father   . Hypertension Father    History  Substance Use Topics  . Smoking status: Never Smoker   . Smokeless tobacco: Never Used  . Alcohol Use: No    Review of Systems  Gastrointestinal: Negative for abdominal pain.  Musculoskeletal: Positive for back pain.  All other systems reviewed and are negative.     Allergies  Review of patient's allergies indicates no known allergies.  Home Medications   Prior to Admission medications   Medication Sig Start Date End Date Taking? Authorizing Provider  amLODipine (NORVASC) 10 MG tablet TAKE ONE TABLET BY MOUTH EVERY DAY 01/13/14   Margaree Mackintosh, MD  furosemide (LASIX) 40 MG tablet Take 1 tablet (40 mg total) by mouth daily. 12/06/13   Margaree Mackintosh, MD  labetalol (NORMODYNE) 300 MG tablet Take 1 tablet (300 mg total) by mouth 2 (two) times daily. 12/06/13   Margaree Mackintosh, MD  losartan (COZAAR) 100 MG tablet Take  1 tablet (100 mg total) by mouth daily. 12/06/13   Margaree Mackintosh, MD  metFORMIN (GLUCOPHAGE) 500 MG tablet Take 1 tablet (500 mg total) by mouth 2 (two) times daily with a meal. 07/19/13   Margaree Mackintosh, MD  Multiple Vitamin (MULTIVITAMIN) tablet Take 1 tablet by mouth daily.      Historical Provider, MD  potassium chloride SA (K-DUR,KLOR-CON) 20 MEQ tablet Take 20 mEq by mouth daily. Only taking sporadically     Historical Provider, MD  simvastatin (ZOCOR) 20 MG tablet Take 1 tablet (20 mg total) by mouth every evening. 03/11/11 03/10/12  Margaree Mackintosh, MD   BP 161/110 mmHg  Pulse 59  Temp(Src) 98.5 F (36.9 C) (Oral)  Resp 20  SpO2 100% Physical Exam  Constitutional: He is oriented to person, place, and time. He appears well-developed and well-nourished.  HENT:  Head: Normocephalic.  Eyes: EOM are normal.  Neck: Normal range of motion.  Cardiovascular: Normal rate.   Pulmonary/Chest: Effort normal.  Abdominal: Soft. He exhibits no distension.  Musculoskeletal: Normal range of motion. He exhibits no edema or tenderness.  Pain with movement.  nontender lumbar sacral spine,  Tender lateral left lower back,   Neurological: He is alert and oriented to person, place, and time. He has normal reflexes. Coordination normal.  Skin: Skin is warm.  Psychiatric: He has a normal mood and affect.  Nursing note and vitals reviewed.   ED Course  Procedures (including critical care time) Labs Review Labs Reviewed  URINALYSIS, ROUTINE W REFLEX MICROSCOPIC    Imaging Review No results found.   EKG Interpretation None      MDM ua normal   Final diagnoses:  Lumbar strain, initial encounter    Urine negative,  Pt advised ice, rest Ibuprofen Robaxin Hydrocodone See your Physicain for recheck next week if pain persist    Elson AreasLeslie K Sofia, PA-C 07/27/14 2030  Tilden FossaElizabeth Rees, MD 07/27/14 2053

## 2015-03-04 DIAGNOSIS — Z029 Encounter for administrative examinations, unspecified: Secondary | ICD-10-CM

## 2015-03-12 ENCOUNTER — Other Ambulatory Visit: Payer: Self-pay | Admitting: Internal Medicine

## 2015-03-12 NOTE — Telephone Encounter (Signed)
Please call pt. Cannot refill without  appointment. Not seen in sometime. Tell pharmacy same thing.

## 2015-03-24 ENCOUNTER — Other Ambulatory Visit: Payer: Self-pay | Admitting: Internal Medicine

## 2015-04-10 ENCOUNTER — Encounter: Payer: Self-pay | Admitting: Internal Medicine

## 2015-04-10 ENCOUNTER — Ambulatory Visit (INDEPENDENT_AMBULATORY_CARE_PROVIDER_SITE_OTHER): Payer: 59 | Admitting: Internal Medicine

## 2015-04-10 VITALS — BP 130/100 | HR 76 | Temp 98.1°F | Resp 20 | Ht 68.0 in | Wt 246.0 lb

## 2015-04-10 DIAGNOSIS — R739 Hyperglycemia, unspecified: Secondary | ICD-10-CM

## 2015-04-10 DIAGNOSIS — E669 Obesity, unspecified: Secondary | ICD-10-CM | POA: Diagnosis not present

## 2015-04-10 DIAGNOSIS — E8881 Metabolic syndrome: Secondary | ICD-10-CM | POA: Diagnosis not present

## 2015-04-10 DIAGNOSIS — I10 Essential (primary) hypertension: Secondary | ICD-10-CM | POA: Diagnosis not present

## 2015-04-10 DIAGNOSIS — Z23 Encounter for immunization: Secondary | ICD-10-CM | POA: Diagnosis not present

## 2015-04-10 LAB — BASIC METABOLIC PANEL
BUN: 14 mg/dL (ref 7–25)
CO2: 29 mmol/L (ref 20–31)
Calcium: 8.9 mg/dL (ref 8.6–10.3)
Chloride: 102 mmol/L (ref 98–110)
Creat: 1.26 mg/dL (ref 0.60–1.35)
GLUCOSE: 124 mg/dL — AB (ref 65–99)
POTASSIUM: 3.9 mmol/L (ref 3.5–5.3)
SODIUM: 140 mmol/L (ref 135–146)

## 2015-04-10 LAB — HEMOGLOBIN A1C
Hgb A1c MFr Bld: 6.1 % — ABNORMAL HIGH (ref <5.7)
Mean Plasma Glucose: 128 mg/dL — ABNORMAL HIGH (ref <117)

## 2015-04-10 NOTE — Patient Instructions (Addendum)
Continue same BP meds. Hemoglobin A1c and basic metabolic panel pending. He is not fasting today. Restart lipid-lowering medication and other antihypertensive medications refilled. Return in 4 months. Have fasting lipid panel and hemoglobin A1c at that time

## 2015-04-11 LAB — MICROALBUMIN, URINE: Microalb, Ur: 0.4 mg/dL

## 2015-06-28 ENCOUNTER — Encounter: Payer: Self-pay | Admitting: Internal Medicine

## 2015-06-28 DIAGNOSIS — E8881 Metabolic syndrome: Secondary | ICD-10-CM

## 2015-06-28 HISTORY — DX: Metabolic syndrome: E88.81

## 2015-06-28 HISTORY — DX: Metabolic syndrome: E88.810

## 2015-06-28 NOTE — Progress Notes (Signed)
   Subjective:    Patient ID: MEAD SLANE, male    DOB: 07/18/1980, 35 y.o.   MRN: [id number]  HPI He was last here in August 2015. Apparently in the interim, he lost his job and also his health insurance. He has a history of type 2 diabetes mellitus, obesity, essential hypertension, metabolic syndrome, hyperlipidemia. He is in need of several medications to be refilled. We did not check lipid panel because he's been off Zocor. He is on metformin for glucose control along with diet neck size. Previously was on Normodyne, Lasix, amlodipine, and losartan for hypertension. Blood pressure today is 130/100. He feels well.    Review of Systems     Objective:   Physical Exam Skin warm and dry. Nodes none. Neck is supple without JVD thyromegaly or carotid bruits. Chest clear to auscultation. Cardiac exam regular rate and rhythm normal S1 and S2. Extremities without edema.       Assessment & Plan:  Controlled type 2 diabetes mellitus. Hemoglobin A1c is 6.1%  History of hyperlipidemia-lipid panel not checked today. Restart Zocor and return in 4 months  Essential hypertension blood pressure is 130/100. Medications refilled  Obesity-continue diet and exercise efforts  Metabolic syndrome  Plan: Refilled medications and return in 4 months for office visit, hemoglobin A1c, lipid panel, liver functions, blood pressure check.

## 2015-12-18 ENCOUNTER — Telehealth: Payer: Self-pay | Admitting: Internal Medicine

## 2015-12-18 NOTE — Telephone Encounter (Signed)
Patient called today stating that he's changing insurance company's.  He's going to have to cancel his CPE scheduled for 9/1.  Advised patient that if he cancel's that appointment, we are currently scheduling at the end of October for the next available physical/annual exam's.    Patient advised he will call us back when he gets his new insurance and gets everything straightened out.  He will call back at that time and reschedule CPE Labs and CPE at that time.

## 2015-12-29 ENCOUNTER — Other Ambulatory Visit: Payer: 59 | Admitting: Internal Medicine

## 2016-01-01 ENCOUNTER — Encounter: Payer: 59 | Admitting: Internal Medicine

## 2016-04-13 ENCOUNTER — Other Ambulatory Visit: Payer: Self-pay | Admitting: Internal Medicine

## 2016-04-14 NOTE — Telephone Encounter (Signed)
Has appt Feb 5th. Please refill through Feb.

## 2016-05-31 ENCOUNTER — Encounter: Payer: 59 | Admitting: Internal Medicine

## 2016-06-03 ENCOUNTER — Telehealth: Payer: Self-pay | Admitting: Internal Medicine

## 2016-06-03 NOTE — Telephone Encounter (Signed)
Patient called to confirm CPE appointment for Monday, February 5 and 11 AM with fasting lab work. Says he did not get robotic telephone call reminding him up appointment.

## 2016-06-06 ENCOUNTER — Encounter: Payer: Self-pay | Admitting: Internal Medicine

## 2016-06-06 ENCOUNTER — Ambulatory Visit (INDEPENDENT_AMBULATORY_CARE_PROVIDER_SITE_OTHER): Payer: 59 | Admitting: Internal Medicine

## 2016-06-06 VITALS — BP 182/138 | HR 70 | Ht 68.0 in | Wt 252.0 lb

## 2016-06-06 DIAGNOSIS — E8881 Metabolic syndrome: Secondary | ICD-10-CM | POA: Diagnosis not present

## 2016-06-06 DIAGNOSIS — Z0000 Encounter for general adult medical examination without abnormal findings: Secondary | ICD-10-CM

## 2016-06-06 DIAGNOSIS — I10 Essential (primary) hypertension: Secondary | ICD-10-CM

## 2016-06-06 DIAGNOSIS — K429 Umbilical hernia without obstruction or gangrene: Secondary | ICD-10-CM

## 2016-06-06 DIAGNOSIS — E119 Type 2 diabetes mellitus without complications: Secondary | ICD-10-CM

## 2016-06-06 DIAGNOSIS — E785 Hyperlipidemia, unspecified: Secondary | ICD-10-CM | POA: Diagnosis not present

## 2016-06-06 LAB — POCT URINALYSIS DIPSTICK
BILIRUBIN UA: NEGATIVE
GLUCOSE UA: NEGATIVE
Ketones, UA: NEGATIVE
Leukocytes, UA: NEGATIVE
NITRITE UA: NEGATIVE
Protein, UA: NEGATIVE
RBC UA: NEGATIVE
Spec Grav, UA: <=1.005
Urobilinogen, UA: NEGATIVE
pH, UA: 6.5

## 2016-06-06 LAB — COMPREHENSIVE METABOLIC PANEL
ALT: 22 U/L (ref 9–46)
AST: 28 U/L (ref 10–40)
Albumin: 4.3 g/dL (ref 3.6–5.1)
Alkaline Phosphatase: 61 U/L (ref 40–115)
BUN: 10 mg/dL (ref 7–25)
CHLORIDE: 103 mmol/L (ref 98–110)
CO2: 29 mmol/L (ref 20–31)
Calcium: 9.1 mg/dL (ref 8.6–10.3)
Creat: 1.32 mg/dL (ref 0.60–1.35)
GLUCOSE: 97 mg/dL (ref 65–99)
Potassium: 3.9 mmol/L (ref 3.5–5.3)
Sodium: 139 mmol/L (ref 135–146)
Total Bilirubin: 0.7 mg/dL (ref 0.2–1.2)
Total Protein: 7.6 g/dL (ref 6.1–8.1)

## 2016-06-06 LAB — LIPID PANEL
CHOL/HDL RATIO: 6.1 ratio — AB (ref <5.0)
Cholesterol: 238 mg/dL — ABNORMAL HIGH (ref <200)
HDL: 39 mg/dL — ABNORMAL LOW (ref >40)
LDL CALC: 179 mg/dL — AB (ref <100)
Triglycerides: 98 mg/dL (ref <150)
VLDL: 20 mg/dL (ref <30)

## 2016-06-06 LAB — CBC WITH DIFFERENTIAL/PLATELET
BASOS ABS: 43 {cells}/uL (ref 0–200)
Basophils Relative: 1 %
EOS ABS: 86 {cells}/uL (ref 15–500)
Eosinophils Relative: 2 %
HCT: 43.8 % (ref 38.5–50.0)
Hemoglobin: 14.1 g/dL (ref 13.2–17.1)
LYMPHS PCT: 46 %
Lymphs Abs: 1978 cells/uL (ref 850–3900)
MCH: 26.4 pg — AB (ref 27.0–33.0)
MCHC: 32.2 g/dL (ref 32.0–36.0)
MCV: 81.9 fL (ref 80.0–100.0)
MPV: 9.4 fL (ref 7.5–12.5)
Monocytes Absolute: 344 cells/uL (ref 200–950)
Monocytes Relative: 8 %
Neutro Abs: 1849 cells/uL (ref 1500–7800)
Neutrophils Relative %: 43 %
PLATELETS: 242 10*3/uL (ref 140–400)
RBC: 5.35 MIL/uL (ref 4.20–5.80)
RDW: 14 % (ref 11.0–15.0)
WBC: 4.3 10*3/uL (ref 3.8–10.8)

## 2016-06-06 MED ORDER — SIMVASTATIN 20 MG PO TABS: 20.0000 mg | ORAL_TABLET | Freq: Every evening | ORAL | 5 refills | Status: DC

## 2016-06-06 MED ORDER — FUROSEMIDE 40 MG PO TABS: 40.0000 mg | ORAL_TABLET | Freq: Every day | ORAL | 0 refills | Status: DC

## 2016-06-06 MED ORDER — LABETALOL HCL 300 MG PO TABS: 300.0000 mg | ORAL_TABLET | Freq: Two times a day (BID) | ORAL | 0 refills | Status: DC

## 2016-06-06 MED ORDER — AMLODIPINE BESYLATE 10 MG PO TABS: 10.0000 mg | ORAL_TABLET | Freq: Every day | ORAL | 0 refills | Status: DC

## 2016-06-06 MED ORDER — LOSARTAN POTASSIUM 100 MG PO TABS: 100.0000 mg | ORAL_TABLET | Freq: Every day | ORAL | 0 refills | Status: DC

## 2016-06-06 NOTE — Progress Notes (Signed)
   Subjective:    Patient ID: Charles Higgins, male    DOB: 1980/06/07, 36 y.o.   MRN: [id number]  HPI 36 year old Black Male in today for health maintenance exam and evaluation of medical issues. He has a history of diabetes and hypertension. He's not been seen here since December 2016.  At time of last visit he was on 4 antihypertensive medications, potassium supplement and simvastatin in addition to metformin. He is no longer taking metformin. Hasn't checked his Accu-Chek since last year.  Has not had annual diabetic eye exam. Was reminded to do so. Declines flu vaccine.  Ran out of BP med about 4 days ago.Refill was not requested over the weekend.  Social history: Married. One son. Expecting another baby in September. Wife employed. Patient employed by [employer] in a  [employer]. He used to work for [employer] but was laid off. In 2017 he was out of work for about 8 months. 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 Does not smoke, use alcohol or drugs.  Family history: Diabetes and hypertension in his father.      Review of Systems  Constitutional: Negative.   Respiratory: Negative.   Cardiovascular: Negative for chest pain.  Gastrointestinal: Negative.   Genitourinary: Negative.   Neurological: Negative.   Psychiatric/Behavioral: Negative.   All other systems reviewed and are negative.      Objective:   Physical Exam  Constitutional: He is oriented to person, place, and time. He appears well-developed and well-nourished.  HENT:  Head: Normocephalic and atraumatic.  Right Ear: External ear normal.  Left Ear: External ear normal.  Eyes: Conjunctivae and EOM are normal. Pupils are equal, round, and reactive to light. Left eye exhibits no discharge.  Neck: No JVD present. No thyromegaly present.  Cardiovascular: Normal rate, regular rhythm, normal heart sounds and intact distal pulses.   No murmur heard. Pulmonary/Chest: No respiratory distress. He has no wheezes. He  has no rales.  Abdominal: Soft. Bowel sounds are normal. He exhibits no distension and no mass. There is no tenderness. There is no rebound and no guarding.  Musculoskeletal: He exhibits no edema.  Lymphadenopathy:    He has no cervical adenopathy.  Neurological: He is alert and oriented to person, place, and time. He has normal reflexes. No cranial nerve deficit. Coordination normal.  Skin: Skin is warm and dry. No rash noted. He is not diaphoretic.  Psychiatric: He has a normal mood and affect. His behavior is normal. Judgment and thought content normal.  Vitals reviewed.         Assessment & Plan:  Essential HTN Adult onset of diabetes- now with normal hemoglobin A1c Obesity  Hyperlipidemia  Plan: Restart lipid medication. He has elevated LDL and total cholesterol levels. Triglycerides are normal.Hemoglobin A1c is normal at 5.6% in one year ago was 6.1%. Restart all 4 antihypertensive medications and simvastatin in follow-up in 2 weeks. Declines flu vaccine.

## 2016-06-06 NOTE — Patient Instructions (Signed)
Restart all 4 blood pressure medications and simvastatin. Follow-up in 2 weeks. Hemoglobin A1c pending. Needs annual diabetic eye exam. Declines flu vaccine.

## 2016-06-07 LAB — HEMOGLOBIN A1C
Hgb A1c MFr Bld: 5.6 % (ref <5.7)
MEAN PLASMA GLUCOSE: 114 mg/dL

## 2016-06-07 LAB — MICROALBUMIN / CREATININE URINE RATIO
Creatinine, Urine: 54 mg/dL (ref 20–370)
MICROALB UR: 0.2 mg/dL
Microalb Creat Ratio: 4 mcg/mg creat (ref <30)

## 2016-06-16 ENCOUNTER — Ambulatory Visit (INDEPENDENT_AMBULATORY_CARE_PROVIDER_SITE_OTHER): Payer: 59 | Admitting: Internal Medicine

## 2016-06-16 VITALS — BP 142/110 | HR 69 | Temp 97.2°F | Wt 253.8 lb

## 2016-06-16 DIAGNOSIS — I10 Essential (primary) hypertension: Secondary | ICD-10-CM

## 2016-06-26 ENCOUNTER — Encounter: Payer: Self-pay | Admitting: Internal Medicine

## 2016-06-26 NOTE — Progress Notes (Signed)
   Subjective:    Patient ID: Charles Higgins, male    DOB: 07-25-1980, 36 y.o.   MRN: [id number]  HPI He came in today for blood pressure check. Blood pressure was elevated at 142/110. Says Normodyne is costing $50 a month which he finds to be a hardship. He is on losartan 100 mg daily, furosemide 40 mg daily and Normodyne 300 mg daily. Is also on Norvasc 10 mg daily. He has difficult to control hypertension. It is point I'm not quite clear what his options are in a think he should be evaluated by cardiology. He also has diabetes mellitus but his recent hemoglobin A1c was normal at 5.6% and had been 6.1% year ago. He has hyperlipidemia with total cholesterol 238 and LDL cholesterol 179.   Review of Systems     Objective:   Physical Exam  He was not examined today. We are going to refer him to Cardiology for evaluation of hypertension.      Assessment & Plan:  Essential hypertension-difficult to control  Plan: Cardiology consultation.  Addendum to 06/27/16 spoke with his wife Friday and told her that he had not returned call from Cardiology. They're attempting to contact him.

## 2016-06-26 NOTE — Patient Instructions (Signed)
Referral to cardiology for consultation regarding difficult to control hypertension.[phone]

## 2016-06-27 ENCOUNTER — Encounter: Payer: Self-pay | Admitting: Internal Medicine

## 2016-07-06 ENCOUNTER — Encounter: Payer: Self-pay | Admitting: Cardiology

## 2016-07-06 ENCOUNTER — Ambulatory Visit (INDEPENDENT_AMBULATORY_CARE_PROVIDER_SITE_OTHER): Payer: 59 | Admitting: Cardiology

## 2016-07-06 VITALS — BP 160/88 | HR 65 | Ht 68.0 in | Wt 253.8 lb

## 2016-07-06 DIAGNOSIS — E785 Hyperlipidemia, unspecified: Secondary | ICD-10-CM | POA: Diagnosis not present

## 2016-07-06 DIAGNOSIS — E669 Obesity, unspecified: Secondary | ICD-10-CM | POA: Diagnosis not present

## 2016-07-06 DIAGNOSIS — R0683 Snoring: Secondary | ICD-10-CM

## 2016-07-06 DIAGNOSIS — I10 Essential (primary) hypertension: Secondary | ICD-10-CM

## 2016-07-06 MED ORDER — SPIRONOLACTONE 25 MG PO TABS: 12.5000 mg | ORAL_TABLET | Freq: Every day | ORAL | 3 refills | Status: DC

## 2016-07-06 MED ORDER — SIMVASTATIN 20 MG PO TABS: 20.0000 mg | ORAL_TABLET | Freq: Every day | ORAL | 3 refills | Status: DC

## 2016-07-06 MED ORDER — LOSARTAN POTASSIUM-HCTZ 100-25 MG PO TABS: 1.0000 | ORAL_TABLET | Freq: Every day | ORAL | 3 refills | Status: DC

## 2016-07-06 NOTE — Progress Notes (Addendum)
Cardiology Office Note    Date:  07/06/2016   ID:  Charles MagicKeith M Pardy, DOB Jun 17, 1980, MRN [id number]  PCP:  Margaree MackintoshMary J Baxley, MD  Cardiologist:  New- Dr Katrinka BlazingSmith  Referring MD:  Marlan PalauMary Baxley, MD  Chief Complaint  Patient presents with  . Hypertension    History of Present Illness:  Charles Higgins is a 36 y.o. male with a medical history of hypertension and hyperlipidemia. He had been diagnosed with diabetes with A1c of 6.4 for one occurrence, but A1c is down to 5.6 and he has never taken any antidiabetic meds.  His blood pressure has been resistant at primary care. BP is still elevated despite treatment with amlodipine 10 mg, Losartan 100 mg dialy, lasix 40 mg daily and labetalol 300 mg bid. BP on 06/06/16: 182/138 when pt was not taking his meds. BP 142/110 after 2 weeks of being back on his meds.  He was diagnosed with hypertension around 2009, age 36. His father had HTN and subsequent kidney failure. None of his siblings have HTN.   Labetalol is costing him $50/month and this is a hardship for him.   He has joined [employer] and has been trying to exercise, but time limits him.   No chest pain, edema, orthopnea, palpitations, near syncope or syncope. He has occ headache when his BP is up, but none recently.  Past Medical History:  Diagnosis Date  . Controlled diabetes mellitus type II without complication (HCC) 08/25/2013  . Elevated serum creatinine 08/25/2013  . Hyperlipidemia 06/23/2011  . Hypertension 03/10/2011  . Metabolic syndrome 06/28/2015  . Umbilical hernia 06/23/2011    Past Surgical History:  Procedure Laterality Date  . NO PAST SURGERIES      Current Medications: Outpatient Medications Prior to Visit  Medication Sig Dispense Refill  . amLODipine (NORVASC) 10 MG tablet Take 1 tablet (10 mg total) by mouth daily. 90 tablet 0  . furosemide (LASIX) 40 MG tablet Take 1 tablet (40 mg total) by mouth daily. 90 tablet 0  . labetalol (NORMODYNE) 300 MG tablet Take 1 tablet  (300 mg total) by mouth 2 (two) times daily. 180 tablet 0  . losartan (COZAAR) 100 MG tablet Take 1 tablet (100 mg total) by mouth daily. 90 tablet 0  . simvastatin (ZOCOR) 20 MG tablet Take 1 tablet (20 mg total) by mouth every evening. (Patient not taking: Reported on 07/06/2016) 30 tablet 5   No facility-administered medications prior to visit.      Allergies:   Patient has no known allergies.   Social History   Social History  . Marital status: Married    Spouse name: N/A  . Number of children: N/A  . Years of education: N/A   Occupational History  . [employer]   Social History Main Topics  . Smoking status: Never Smoker  . Smokeless tobacco: Never Used  . Alcohol use No  . Drug use: No  . Sexual activity: Yes   Other Topics Concern  . None   Social History Narrative   Has one 37 year old son and one on the way.     Family History:  The patient's family history includes CAD in his father; Diabetes in his father and maternal grandmother; Hypertension in his father and mother; Kidney failure in his father.   ROS:   Please see the history of present illness.    Review of Systems  All other systems reviewed and are negative.  All other systems reviewed and are negative.   PHYSICAL EXAM:   VS:  BP (!) 160/88 (BP Location: Right Arm, Patient Position: Sitting, Cuff Size: Large)   Pulse 65   Ht 5\' 8"  (1.727 m)   Wt 253 lb 12.8 oz (115.1 kg)   BMI 38.59 kg/m    Physical Exam  Constitutional: He is oriented to person, place, and time. He appears well-developed and well-nourished.  HENT:  Head: Normocephalic and atraumatic.  Eyes: No scleral icterus.  Neck: Normal range of motion. Neck supple. No JVD present.  Cardiovascular: Normal rate, regular rhythm and normal heart sounds.   No murmur heard. Pulmonary/Chest: Effort normal and breath sounds normal. He has no wheezes. He has no rales.  Abdominal: Soft. Bowel sounds are normal. There is no tenderness.    Musculoskeletal: Normal range of motion.  Neurological: He is alert and oriented to person, place, and time.  Skin: Skin is warm and dry.  Psychiatric: He has a normal mood and affect.   Wt Readings from Last 3 Encounters:  07/06/16 253 lb 12.8 oz (115.1 kg)  06/16/16 253 lb 12 oz (115.1 kg)  06/06/16 252 lb (114.3 kg)      Studies/Labs Reviewed:   EKG:  EKG is ordered today.  The ekg ordered today demonstrates NSR 65 bpm, Non-specific T wave changes. QTC 428  Recent Labs: 06/06/2016: ALT 22; BUN 10; Creat 1.32; Hemoglobin 14.1; Platelets 242; Potassium 3.9; Sodium 139   Lipid Panel    Component Value Date/Time   CHOL 238 (H) 06/06/2016 1151   TRIG 98 06/06/2016 1151   HDL 39 (L) 06/06/2016 1151   CHOLHDL 6.1 (H) 06/06/2016 1151   VLDL 20 06/06/2016 1151   LDLCALC 179 (H) 06/06/2016 1151    Additional studies/ records that were reviewed today include:  None   ASSESSMENT:    1. Essential hypertension   2. Hyperlipidemia, unspecified hyperlipidemia type   3. Obesity (BMI 35.0-39.9 without comorbidity)   4. Snoring      PLAN:  In order of problems listed above:  1. Hypertension- has been resistant at primary care. BP is still elevated despite treatment with amlodipine 10 mg, Losartan 100 mg dialy, lasix 40 mg daily and labetalol 300 mg bid. Will check renal dopplers to assess for renal artery stenosis. Not likely to get accurate aldosterone testing while on ARB.   -Renal artery duplex - Pt does not want to do at this time, he  will call to schedule  -Echo to evaluate for hypertensive changes- Pt will call to  schedule  -Low sodium diet       -Avoid NSAIDS and alcohol and energy drinks  -Labetalol is cost prohibitive, switch to carvedilol 12.5 mg bid  -Switch lasix and losartan to Hyzaar 100/25  -Add spironolactone 12.5 mg daily  -Continue amlodipine 10 mg daily     -Will check BMET in 2 weeks  -Will have him follow up in the HTN clinic in 3 weeks for BP  check   and medication adjustment      -Follow up with Cardiology in 6 weeks  2. Hyperlipidemia- LDL 179, Triglycerides 98 (06/06/2016). Simvastatin 20 mg restarted and PCP to follow up.  3. Obesity- Advised on weight loss with reduced caloric intake and increase physical activity. Discussed increasing intake of fruits and vegetables and reduction of intake of processed foods.  4.   Occasional snoring. HTN may be harder to control with OSA. Pt snores occ. No daytime somnolence or  am headaches. Advised to have wife note if he snores a lot or stops breathing during sleep. Consider sleep study.  Medication Adjustments/Labs and Tests Ordered: Current medicines are reviewed at length with the patient today.  Concerns regarding medicines are outlined above.  Medication changes, Labs and Tests ordered today are listed in the Patient Instructions below. Patient Instructions  Medication Instructions:  1. START HYZAAR 100/25 MG 1 TABLET DAILY 2. START SPIRONOLACTONE 25 MG TABLET WITH THE DIRECTIONS TO TAKE 1/2 TABLET = 12.5 MG DAILY 3. START COREG 12.5 MG TWICE DAILY  4. STOP LOSARTAN 5.  STOP LASIX 6. STOP LABETALOL  7. YOU WILL CONTINUE ALL OTHER MEDICATIONS AS PRESCRIBED   Labwork: IN 2 WEEKS YOU WILL NEED BMET TO BE DONE DUE TO MEDICATION CHANGES  Testing/Procedures: 1. Your physician has requested that you have a renal artery duplex. During this test, an ultrasound is used to evaluate blood flow to the kidneys. Allow one hour for this exam. Do not eat after midnight the day before and avoid carbonated beverages. Take your medications as you usually do. PT STATES HE WILL CALL BACK TO SCHEDULE  2. Your physician has requested that you have an echocardiogram. Echocardiography is a painless test that uses sound waves to create images of your heart. It provides your doctor with information about the size and shape of your heart and how well your heart's chambers and valves are working. This procedure takes  approximately one hour. There are no restrictions for this procedure.  PT STATES HE WILL CALL BACK TO SCHEDULE  Follow-Up: 1. YOU ARE BEING REFERRED TO THE HTN CLINIC TO BE SEEN IN 2 WEEKS  2. SCOTT WEAVER, PAC IN 6 WEEKS   Any Other Special Instructions Will Be Listed Below (If Applicable).  If you need a refill on your cardiac medications before your next appointment, please call your pharmacy.     Signed, Berton Bon, NP  07/06/2016 4:12 PM    [facility] [address] [place], [place], [place]  [address] Phone: [phone]; Fax: [phone]

## 2016-07-06 NOTE — Patient Instructions (Addendum)
Medication Instructions:  1. START HYZAAR 100/25 MG 1 TABLET DAILY 2. START SPIRONOLACTONE 25 MG TABLET WITH THE DIRECTIONS TO TAKE 1/2 TABLET = 12.5 MG DAILY 3. START COREG 12.5 MG TWICE DAILY  4. STOP LOSARTAN 5.  STOP LASIX 6. STOP LABETALOL  7. YOU WILL CONTINUE ALL OTHER MEDICATIONS AS PRESCRIBED   Labwork: IN 2 WEEKS YOU WILL NEED BMET TO BE DONE DUE TO MEDICATION CHANGES  Testing/Procedures: 1. Your physician has requested that you have a renal artery duplex. During this test, an ultrasound is used to evaluate blood flow to the kidneys. Allow one hour for this exam. Do not eat after midnight the day before and avoid carbonated beverages. Take your medications as you usually do. PT STATES HE WILL CALL BACK TO SCHEDULE  2. Your physician has requested that you have an echocardiogram. Echocardiography is a painless test that uses sound waves to create images of your heart. It provides your doctor with information about the size and shape of your heart and how well your heart's chambers and valves are working. This procedure takes approximately one hour. There are no restrictions for this procedure.  PT STATES HE WILL CALL BACK TO SCHEDULE  Follow-Up: 1. YOU ARE BEING REFERRED TO THE HTN CLINIC TO BE SEEN IN 2 WEEKS  2. SCOTT WEAVER, PAC IN 6 WEEKS   Any Other Special Instructions Will Be Listed Below (If Applicable).  If you need a refill on your cardiac medications before your next appointment, please call your pharmacy.

## 2016-07-06 NOTE — Progress Notes (Signed)
Thanks

## 2016-07-20 ENCOUNTER — Other Ambulatory Visit: Payer: 59 | Admitting: *Deleted

## 2016-07-20 DIAGNOSIS — I10 Essential (primary) hypertension: Secondary | ICD-10-CM

## 2016-07-21 ENCOUNTER — Telehealth: Payer: Self-pay | Admitting: *Deleted

## 2016-07-21 LAB — BASIC METABOLIC PANEL
BUN/Creatinine Ratio: 10 (ref 9–20)
BUN: 14 mg/dL (ref 6–20)
CHLORIDE: 101 mmol/L (ref 96–106)
CO2: 25 mmol/L (ref 18–29)
CREATININE: 1.36 mg/dL — AB (ref 0.76–1.27)
Calcium: 9.6 mg/dL (ref 8.7–10.2)
GFR calc Af Amer: 77 mL/min/{1.73_m2} (ref >59)
GFR calc non Af Amer: 66 mL/min/{1.73_m2} (ref >59)
GLUCOSE: 75 mg/dL (ref 65–99)
POTASSIUM: 3.7 mmol/L (ref 3.5–5.2)
SODIUM: 144 mmol/L (ref 134–144)

## 2016-07-21 NOTE — Telephone Encounter (Signed)
-----   Message from Rosalio MacadamiaLori C Gerhardt, NP sent at 07/21/2016  8:35 AM EDT ----- Ok to report.  Reviewed for Tereso NewcomerScott Weaver, PA Labs are stable - would continue on current regimen as outlined at his visit.

## 2016-07-21 NOTE — Telephone Encounter (Signed)
DPR ok to lmom. Lmom lab work ok. No changes to be made with medications. If any questions please call the office.

## 2016-08-01 ENCOUNTER — Encounter: Payer: Self-pay | Admitting: Physician Assistant

## 2016-08-03 ENCOUNTER — Telehealth: Payer: Self-pay | Admitting: *Deleted

## 2016-08-03 MED ORDER — CARVEDILOL 12.5 MG PO TABS: 12.5000 mg | ORAL_TABLET | Freq: Two times a day (BID) | ORAL | 0 refills | Status: DC

## 2016-08-03 NOTE — Telephone Encounter (Signed)
Pt saw Berton Bon, NP, with Tereso Newcomer.  Pt was supposed to d/c Labetalol and start Coreg 12.5 mg bid, but it was never sent to the pharmacy.  Pt had enough Labetalol left to take and he called today requesting Coreg be sent to his pharmacy, which I done this a.m.  Just fyi!

## 2016-08-04 ENCOUNTER — Ambulatory Visit: Payer: 59

## 2016-08-16 ENCOUNTER — Ambulatory Visit: Payer: 59 | Admitting: Physician Assistant

## 2016-08-21 NOTE — Progress Notes (Signed)
Cardiology Office Note:    Date:  08/22/2016   ID:  Armanda Magic, DOB [birth year], MRN [id number]  PCP:  Margaree Mackintosh, MD  Cardiologist:  Dr. Verdis Prime   Electrophysiologist:  n/a  Referring MD: Margaree Mackintosh, MD   Chief Complaint  Patient presents with  . Follow-up    HTN    History of Present Illness:    Charles Higgins is a 36 y.o. male with a hx of HTN, HL, diet controlled DM2.  He established with Cardiology in 3/18 for management of resistant HTN.  Renal artery [place] and echo were recommended but the patient declined.  Labetalol was changed to Carvedilol.  Lasix was stopped and Losartan was changed to Hyzaar.  Spironolactone was added.    He is here for follow up.  He is here alone.  Since last seen, he denies chest pain, shortness of breath, syncope, orthopnea, PND or significant pedal edema.   Prior CV studies:   The following studies were reviewed today:  None   Past Medical History:  Diagnosis Date  . Controlled diabetes mellitus type II without complication (HCC) 08/25/2013  . Elevated serum creatinine 08/25/2013  . Hyperlipidemia 06/23/2011  . Hypertension 03/10/2011  . Metabolic syndrome 06/28/2015  . Umbilical hernia 06/23/2011    Past Surgical History:  Procedure Laterality Date  . NO PAST SURGERIES      Current Medications: Current Meds  Medication Sig  . amLODipine (NORVASC) 10 MG tablet Take 1 tablet (10 mg total) by mouth daily.  . carvedilol (COREG) 12.5 MG tablet Take 1 tablet (12.5 mg total) by mouth 2 (two) times daily.  Marland Kitchen losartan-hydrochlorothiazide (HYZAAR) 100-25 MG tablet Take 1 tablet by mouth daily.  . simvastatin (ZOCOR) 20 MG tablet Take 1 tablet (20 mg total) by mouth at bedtime.  Marland Kitchen spironolactone (ALDACTONE) 25 MG tablet Take 0.5 tablets (12.5 mg total) by mouth daily.     Allergies:   Patient has no known allergies.   Social History   Social History  . Marital status: Married    Spouse name: N/A  . Number of children: N/A  .  Years of education: N/A   Occupational History  . Ecologist   Social History Main Topics  . Smoking status: Never Smoker  . Smokeless tobacco: Never Used  . Alcohol use No  . Drug use: No  . Sexual activity: Yes   Other Topics Concern  . None   Social History Narrative   Has one 49 year old son and one on the way.     Family History  Problem Relation Age of Onset  . Diabetes Father   . Hypertension Father   . Kidney failure Father   . CAD Father   . Hypertension Mother   . Diabetes Maternal Grandmother      ROS:   Please see the history of present illness.    ROS All other systems reviewed and are negative.   EKGs/Labs/Other Test Reviewed:    EKG:  EKG is not ordered today.  The ekg ordered today demonstrates n/a  Recent Labs: 06/06/2016: ALT 22; Hemoglobin 14.1; Platelets 242 07/20/2016: BUN 14; Creatinine, Ser 1.36; Potassium 3.7; Sodium 144   Recent Lipid Panel    Component Value Date/Time   CHOL 238 (H) 06/06/2016 1151   TRIG 98 06/06/2016 1151   HDL 39 (L) 06/06/2016 1151   CHOLHDL 6.1 (H) 06/06/2016 1151   VLDL 20 06/06/2016 1151  LDLCALC 179 (H) 06/06/2016 1151     Physical Exam:    VS:  BP (!) 140/100   Pulse 66   Ht  (1.778 m)   Wt 252 lb 12.8 oz (114.7 kg)   BMI 36.27 kg/m     Wt Readings from Last 3 Encounters:  08/22/16 252 lb 12.8 oz (114.7 kg)  07/06/16 253 lb 12.8 oz (115.1 kg)  06/16/16 253 lb 12 oz (115.1 kg)     Physical Exam  Constitutional: He is oriented to person, place, and time. He appears well-developed and well-nourished. No distress.  HENT:  Head: Normocephalic and atraumatic.  Eyes: No scleral icterus.  Neck: Normal range of motion. No JVD present. Carotid bruit is not present.  Cardiovascular: Normal rate, regular rhythm, S1 normal and S2 normal.   No murmur heard. Pulmonary/Chest: Effort normal and breath sounds normal. He has no wheezes. He has no rhonchi. He has no rales.  Abdominal: Soft.  There is no tenderness.  No abdominal bruits  Musculoskeletal: He exhibits no edema.  Neurological: He is alert and oriented to person, place, and time.  Skin: Skin is warm and dry.  Psychiatric: He has a normal mood and affect.    ASSESSMENT:    1. Resistant hypertension   2. Class 2 obesity due to excess calories without serious comorbidity with body mass index (BMI) of 36.0 to 36.9 in adult    PLAN:    In order of problems listed above:  1. Resistant hypertension -  He is on multiple medications and his BP remains uncontrolled.  He is not able to pursue an echocardiogram or renal arterial US due to cost.  At this point, I am not sure that is urgent.  Once his insurance kicks in, we can consider getting these tests done.  -  Continue Hyzaar, spironolactone, carvedilol, amlodipine  -  Add clonidine 0.1 mg bid  -  Monitor BP at home.  2. Class 2 obesity due to excess calories without serious comorbidity with body mass index (BMI) of 36.0 to 36.9 in adult - We discussed dietary habits for weight loss and to help control blood pressure.   Dispo:  Return in about 6 weeks (around 10/03/2016) for Routine Follow Up, w/ Tereso Newcomer, PA-C.   Medication Adjustments/Labs and Tests Ordered: Current medicines are reviewed at length with the patient today.  Concerns regarding medicines are outlined above.  Orders placed this visit:  Orders Placed This Encounter  Procedures  . EKG 12-Lead   Medication changes this visit: Meds ordered this encounter  Medications  . cloNIDine (CATAPRES) 0.1 MG tablet    Sig: Take 1 tablet (0.1 mg total) by mouth 2 (two) times daily.    Dispense:  180 tablet    Refill:  3    Signed, Tereso Newcomer, PA-C  08/22/2016 11:36 AM    [facility] [address] [place], [place], [place]  [address] Phone: [phone]; Fax: [phone]

## 2016-08-22 ENCOUNTER — Ambulatory Visit (INDEPENDENT_AMBULATORY_CARE_PROVIDER_SITE_OTHER): Payer: 59 | Admitting: Physician Assistant

## 2016-08-22 ENCOUNTER — Encounter: Payer: Self-pay | Admitting: Physician Assistant

## 2016-08-22 VITALS — BP 140/100 | HR 66 | Ht 70.0 in | Wt 252.8 lb

## 2016-08-22 DIAGNOSIS — E6609 Other obesity due to excess calories: Secondary | ICD-10-CM

## 2016-08-22 DIAGNOSIS — I10 Essential (primary) hypertension: Secondary | ICD-10-CM

## 2016-08-22 DIAGNOSIS — Z6836 Body mass index (BMI) 36.0-36.9, adult: Secondary | ICD-10-CM

## 2016-08-22 MED ORDER — CLONIDINE HCL 0.1 MG PO TABS: 0.1000 mg | ORAL_TABLET | Freq: Two times a day (BID) | ORAL | 3 refills | Status: DC

## 2016-08-22 NOTE — Patient Instructions (Addendum)
Medication Instructions:  1. START CLONIDINE 0.1 MG TWICE DAILY; NEW RX HAS BEEN SENT IN  Labwork: NONE ORDERED  Testing/Procedures: NONE ORDERED  Follow-Up: 09/19/16 3:45 WITH SCOTT WEAVER, PAC   Any Other Special Instructions Will Be Listed Below (If Applicable). MONITOR BLOOD PRESSURE 2-3 TIMES A WEEK AND BRING READINGS TO YOUR FOLLOW UP APPT WITH SCOTT WEAVER, PAC  If you need a refill on your cardiac medications before your next appointment, please call your pharmacy.

## 2016-09-15 ENCOUNTER — Other Ambulatory Visit: Payer: Self-pay | Admitting: Physician Assistant

## 2016-09-19 ENCOUNTER — Encounter: Payer: Self-pay | Admitting: Physician Assistant

## 2016-09-19 ENCOUNTER — Ambulatory Visit (INDEPENDENT_AMBULATORY_CARE_PROVIDER_SITE_OTHER): Payer: 59 | Admitting: Physician Assistant

## 2016-09-19 ENCOUNTER — Encounter (INDEPENDENT_AMBULATORY_CARE_PROVIDER_SITE_OTHER): Payer: Self-pay

## 2016-09-19 VITALS — BP 138/82 | HR 68 | Ht 70.0 in | Wt 249.0 lb

## 2016-09-19 DIAGNOSIS — I10 Essential (primary) hypertension: Secondary | ICD-10-CM | POA: Diagnosis not present

## 2016-09-19 MED ORDER — AMLODIPINE BESYLATE 10 MG PO TABS: 10.0000 mg | ORAL_TABLET | Freq: Every day | ORAL | 3 refills | Status: DC

## 2016-09-19 MED ORDER — CARVEDILOL 12.5 MG PO TABS: 12.5000 mg | ORAL_TABLET | Freq: Two times a day (BID) | ORAL | 3 refills | Status: DC

## 2016-09-19 NOTE — Patient Instructions (Signed)
Medication Instructions:  REFILLS WERE SENT IN FOR COREG AND AMLODIPINE  Your physician recommends that you continue on your current medications as directed. Please refer to the Current Medication list given to you today.   Labwork: NONE ORDERED  Testing/Procedures: NONE ORDERED  Follow-Up: Your physician wants you to follow-up in: 6 MONTHS WITH [facility] You will receive a reminder letter in the mail two months in advance. If you don't receive a letter, please call our office to schedule the follow-up appointment.   Any Other Special Instructions Will Be Listed Below (If Applicable).     If you need a refill on your cardiac medications before your next appointment, please call your pharmacy.

## 2016-09-19 NOTE — Progress Notes (Signed)
Cardiology Office Note:    Date:  09/19/2016   ID:  Charles Higgins, DOB 21-Feb-1981, MRN [id number]  PCP:  Margaree MackintoshBaxley, Mary J, MD  Cardiologist:  Dr. Verdis PrimeHenry Smith    Referring MD: Margaree MackintoshBaxley, Mary J, MD   Chief Complaint  Patient presents with  . Follow-up    Blood pressure    History of Present Illness:    Charles Higgins is a 36 y.o. male with a hx of difficult to control HTN as well as diet controlled DM2, HL.  He established with Cardiology in 3/18 for BP management.  He has declined renal artery ultrasound and echocardiogram in the past.  Last seen 08/22/16.  He returns for follow up.    Charles Higgins is here alone today.  He denies chest pain, shortness of breath, syncope, orthopnea, PND or significant pedal edema.   Prior CV studies:   The following studies were reviewed today:  None   Past Medical History:  Diagnosis Date  . Controlled diabetes mellitus type II without complication (HCC) 08/25/2013  . Elevated serum creatinine 08/25/2013  . Hyperlipidemia 06/23/2011  . Hypertension 03/10/2011  . Metabolic syndrome 06/28/2015  . Umbilical hernia 06/23/2011    Current Medications: Current Meds  Medication Sig  . cloNIDine (CATAPRES) 0.1 MG tablet Take 1 tablet (0.1 mg total) by mouth 2 (two) times daily.  Marland Kitchen. losartan-hydrochlorothiazide (HYZAAR) 100-25 MG tablet Take 1 tablet by mouth daily.  . simvastatin (ZOCOR) 20 MG tablet Take 1 tablet (20 mg total) by mouth at bedtime.  Marland Kitchen. spironolactone (ALDACTONE) 25 MG tablet Take 0.5 tablets (12.5 mg total) by mouth daily.  . [DISCONTINUED] amLODipine (NORVASC) 10 MG tablet Take 1 tablet (10 mg total) by mouth daily.  . [DISCONTINUED] carvedilol (COREG) 12.5 MG tablet TAKE 1 TABLET BY MOUTH TWICE DAILY     Allergies:   Patient has no known allergies.   Social History   Social History  . Marital status: Married    Spouse name: N/A  . Number of children: N/A  . Years of education: N/A   Occupational History  . [employer]    Social History Main Topics  . Smoking status: Never Smoker  . Smokeless tobacco: Never Used  . Alcohol use No  . Drug use: No  . Sexual activity: Yes   Other Topics Concern  . None   Social History Narrative   Has one 37 year old son and one on the way.     Family Hx: The patient's family history includes CAD in his father; Diabetes in his father and maternal grandmother; Hypertension in his father and mother; Kidney failure in his father.  ROS:   Please see the history of present illness.    Review of Systems  HENT:       Dry mouth   All other systems reviewed and are negative.   EKGs/Labs/Other Test Reviewed:    EKG:  EKG is not ordered today.  The ekg ordered today demonstrates n/a  Recent Labs: 06/06/2016: ALT 22; Hemoglobin 14.1; Platelets 242 07/20/2016: BUN 14; Creatinine, Ser 1.36; Potassium 3.7; Sodium 144   Recent Lipid Panel    Component Value Date/Time   CHOL 238 (H) 06/06/2016 1151   TRIG 98 06/06/2016 1151   HDL 39 (L) 06/06/2016 1151   CHOLHDL 6.1 (H) 06/06/2016 1151   VLDL 20 06/06/2016 1151   LDLCALC 179 (H) 06/06/2016 1151     Physical Exam:    VS:  BP 138/82   Pulse 68   Ht 5\' 10"  (1.778 m)   Wt 249 lb (112.9 kg)   SpO2 97%   BMI 35.73 kg/m     Wt Readings from Last 3 Encounters:  09/19/16 249 lb (112.9 kg)  08/22/16 252 lb 12.8 oz (114.7 kg)  07/06/16 253 lb 12.8 oz (115.1 kg)     Physical Exam  Constitutional: He is oriented to person, place, and time. He appears well-developed and well-nourished. No distress.  HENT:  Head: Normocephalic and atraumatic.  Neck: Normal range of motion.  Cardiovascular: Normal rate, regular rhythm, S1 normal and S2 normal.   No murmur heard. Pulmonary/Chest: Effort normal and breath sounds normal. He has no wheezes. He has no rhonchi. He has no rales.  Abdominal: Soft. There is no tenderness.  Musculoskeletal: He exhibits no edema.  Neurological: He is alert and oriented to person, place, and  time.  Skin: Skin is warm and dry.  Psychiatric: He has a normal mood and affect.    ASSESSMENT:    1. Resistant hypertension    PLAN:    In order of problems listed above:  1. Resistant hypertension - He returns for follow-up on difficult to control blood pressure. His blood pressure is now better controlled on multidrug regimen which includes amlodipine, carvedilol, clonidine, losartan/HCTZ, spironolactone. Continue current therapy. He will continue to monitor his blood pressure at home.  Dispo:  Return in about 6 months (around 03/22/2017) for Routine Follow Up, w/ Tereso Newcomer, PA-C.   Medication Adjustments/Labs and Tests Ordered: Current medicines are reviewed at length with the patient today.  Concerns regarding medicines are outlined above.  Orders/Tests:  No orders of the defined types were placed in this encounter.  Medication changes: Meds ordered this encounter  Medications  . amLODipine (NORVASC) 10 MG tablet    Sig: Take 1 tablet (10 mg total) by mouth daily.    Dispense:  90 tablet    Refill:  3    Refill    Order Specific Question:   Supervising Provider    Answer:   ROSS, PAULA V [[id number]]  . carvedilol (COREG) 12.5 MG tablet    Sig: Take 1 tablet (12.5 mg total) by mouth 2 (two) times daily.    Dispense:  180 tablet    Refill:  3    Refill    Order Specific Question:   Supervising Provider    Answer:   Pricilla Riffle [[id number]]   Signed, Tereso Newcomer, PA-C  09/19/2016 4:39 PM    [facility] [address] [place], [place], [place]  [address] Phone: [phone]; Fax: [phone]

## 2016-09-22 ENCOUNTER — Telehealth: Payer: Self-pay | Admitting: Physician Assistant

## 2016-09-22 NOTE — Telephone Encounter (Signed)
Per call on 09-08-16  Mr. Charles Higgins  does not want to have this testing [facility]

## 2016-09-23 NOTE — Telephone Encounter (Signed)
I will route this message to Lizabeth LeydenNina Hammond, NP. Looks like she is the one who these test back when she saw the pt 07/06/16.

## 2016-09-29 ENCOUNTER — Telehealth: Payer: Self-pay | Admitting: Physician Assistant

## 2016-09-29 NOTE — Telephone Encounter (Signed)
[place] Marlborough[place] Macyn Remmert, [place]    09/29/2016 11:11 AM

## 2016-09-29 NOTE — Telephone Encounter (Signed)
07-07-16 Patient was ordered to have Echo and Vas  - Had left several message to call and schedule test.   On 07-07-16, 3-30 and 4-11.   Received a call on 09/08/16 -pt does not want to have this testing done - message taking by [facility].

## 2017-09-19 ENCOUNTER — Other Ambulatory Visit: Payer: Self-pay | Admitting: Physician Assistant

## 2017-09-19 DIAGNOSIS — I10 Essential (primary) hypertension: Secondary | ICD-10-CM

## 2017-10-04 DIAGNOSIS — Z118 Encounter for screening for other infectious and parasitic diseases: Secondary | ICD-10-CM | POA: Diagnosis not present

## 2017-12-06 DIAGNOSIS — Z0100 Encounter for examination of eyes and vision without abnormal findings: Secondary | ICD-10-CM | POA: Diagnosis not present

## 2018-02-20 ENCOUNTER — Other Ambulatory Visit: Payer: Self-pay | Admitting: Physician Assistant

## 2018-02-22 DIAGNOSIS — R361 Hematospermia: Secondary | ICD-10-CM | POA: Diagnosis not present

## 2018-02-22 DIAGNOSIS — Z308 Encounter for other contraceptive management: Secondary | ICD-10-CM | POA: Diagnosis not present

## 2018-03-23 DIAGNOSIS — Z302 Encounter for sterilization: Secondary | ICD-10-CM | POA: Diagnosis not present

## 2018-04-19 ENCOUNTER — Other Ambulatory Visit: Payer: Self-pay | Admitting: Interventional Cardiology

## 2018-04-19 MED ORDER — AMLODIPINE BESYLATE 10 MG PO TABS: 10.0000 mg | ORAL_TABLET | Freq: Every day | ORAL | 0 refills | Status: DC

## 2018-11-08 ENCOUNTER — Other Ambulatory Visit: Payer: Self-pay

## 2018-11-08 ENCOUNTER — Other Ambulatory Visit: Payer: 59 | Admitting: Internal Medicine

## 2018-11-08 DIAGNOSIS — E119 Type 2 diabetes mellitus without complications: Secondary | ICD-10-CM | POA: Diagnosis not present

## 2018-11-08 DIAGNOSIS — I10 Essential (primary) hypertension: Secondary | ICD-10-CM | POA: Diagnosis not present

## 2018-11-08 DIAGNOSIS — R7302 Impaired glucose tolerance (oral): Secondary | ICD-10-CM

## 2018-11-08 DIAGNOSIS — E785 Hyperlipidemia, unspecified: Secondary | ICD-10-CM | POA: Diagnosis not present

## 2018-11-08 DIAGNOSIS — Z0000 Encounter for general adult medical examination without abnormal findings: Secondary | ICD-10-CM | POA: Diagnosis not present

## 2018-11-08 NOTE — Progress Notes (Signed)
Lab only 

## 2018-11-09 ENCOUNTER — Ambulatory Visit (INDEPENDENT_AMBULATORY_CARE_PROVIDER_SITE_OTHER): Payer: 59 | Admitting: Internal Medicine

## 2018-11-09 ENCOUNTER — Encounter: Payer: Self-pay | Admitting: Internal Medicine

## 2018-11-09 VITALS — BP 170/120 | HR 78 | Ht 68.0 in | Wt 248.0 lb

## 2018-11-09 DIAGNOSIS — R7989 Other specified abnormal findings of blood chemistry: Secondary | ICD-10-CM

## 2018-11-09 DIAGNOSIS — I10 Essential (primary) hypertension: Secondary | ICD-10-CM

## 2018-11-09 DIAGNOSIS — E119 Type 2 diabetes mellitus without complications: Secondary | ICD-10-CM

## 2018-11-09 DIAGNOSIS — E785 Hyperlipidemia, unspecified: Secondary | ICD-10-CM

## 2018-11-09 DIAGNOSIS — Z6837 Body mass index (BMI) 37.0-37.9, adult: Secondary | ICD-10-CM

## 2018-11-09 DIAGNOSIS — Z0000 Encounter for general adult medical examination without abnormal findings: Secondary | ICD-10-CM

## 2018-11-09 LAB — POCT URINALYSIS DIPSTICK
Appearance: NEGATIVE
Bilirubin, UA: NEGATIVE
Blood, UA: NEGATIVE
Glucose, UA: NEGATIVE
Ketones, UA: NEGATIVE
Leukocytes, UA: NEGATIVE
Nitrite, UA: NEGATIVE
Odor: NEGATIVE
Protein, UA: POSITIVE — AB
Spec Grav, UA: 1.02 (ref 1.010–1.025)
Urobilinogen, UA: 0.2 E.U./dL
pH, UA: 6 (ref 5.0–8.0)

## 2018-11-09 MED ORDER — CARVEDILOL 12.5 MG PO TABS: 12.5000 mg | ORAL_TABLET | Freq: Two times a day (BID) | ORAL | 5 refills | Status: DC

## 2018-11-09 MED ORDER — ROSUVASTATIN CALCIUM 5 MG PO TABS: ORAL_TABLET | ORAL | 1 refills | Status: DC

## 2018-11-09 MED ORDER — SPIRONOLACTONE 25 MG PO TABS: ORAL_TABLET | ORAL | 0 refills | Status: DC

## 2018-11-09 MED ORDER — AMLODIPINE BESYLATE 10 MG PO TABS: 10.0000 mg | ORAL_TABLET | Freq: Every day | ORAL | 1 refills | Status: DC

## 2018-11-09 MED ORDER — LOSARTAN POTASSIUM-HCTZ 100-25 MG PO TABS: 1.0000 | ORAL_TABLET | Freq: Every day | ORAL | 1 refills | Status: DC

## 2018-11-09 MED ORDER — CLONIDINE HCL 0.1 MG PO TABS: 0.1000 mg | ORAL_TABLET | Freq: Two times a day (BID) | ORAL | 11 refills | Status: DC

## 2018-11-09 NOTE — Addendum Note (Signed)
Addended by: Mady Haagensen on: 11/09/2018 09:01 AM   Modules accepted: Orders

## 2018-11-10 LAB — CBC WITH DIFFERENTIAL/PLATELET
Absolute Monocytes: 449 cells/uL (ref 200–950)
Basophils Absolute: 31 cells/uL (ref 0–200)
Basophils Relative: 0.6 %
Eosinophils Absolute: 92 cells/uL (ref 15–500)
Eosinophils Relative: 1.8 %
HCT: 44 % (ref 38.5–50.0)
Hemoglobin: 14.4 g/dL (ref 13.2–17.1)
Lymphs Abs: 2015 cells/uL (ref 850–3900)
MCH: 26.7 pg — ABNORMAL LOW (ref 27.0–33.0)
MCHC: 32.7 g/dL (ref 32.0–36.0)
MCV: 81.6 fL (ref 80.0–100.0)
MPV: 10.7 fL (ref 7.5–12.5)
Monocytes Relative: 8.8 %
Neutro Abs: 2514 cells/uL (ref 1500–7800)
Neutrophils Relative %: 49.3 %
Platelets: 227 10*3/uL (ref 140–400)
RBC: 5.39 10*6/uL (ref 4.20–5.80)
RDW: 13.1 % (ref 11.0–15.0)
Total Lymphocyte: 39.5 %
WBC: 5.1 10*3/uL (ref 3.8–10.8)

## 2018-11-10 LAB — HEMOGLOBIN A1C
Hgb A1c MFr Bld: 5.7 % of total Hgb — ABNORMAL HIGH (ref <5.7)
Mean Plasma Glucose: 117 (calc)
eAG (mmol/L): 6.5 (calc)

## 2018-11-10 LAB — LIPID PANEL
Cholesterol: 249 mg/dL — ABNORMAL HIGH (ref <200)
HDL: 43 mg/dL (ref >=40)
LDL Cholesterol (Calc): 190 mg/dL (calc) — ABNORMAL HIGH
Non-HDL Cholesterol (Calc): 206 mg/dL (calc) — ABNORMAL HIGH (ref <130)
Total CHOL/HDL Ratio: 5.8 (calc) — ABNORMAL HIGH (ref <5.0)
Triglycerides: 60 mg/dL (ref <150)

## 2018-11-10 LAB — COMPLETE METABOLIC PANEL WITH GFR
AG Ratio: 1.4 (calc) (ref 1.0–2.5)
ALT: 19 U/L (ref 9–46)
AST: 20 U/L (ref 10–40)
Albumin: 4.4 g/dL (ref 3.6–5.1)
Alkaline phosphatase (APISO): 67 U/L (ref 36–130)
BUN/Creatinine Ratio: 11 (calc) (ref 6–22)
BUN: 15 mg/dL (ref 7–25)
CO2: 31 mmol/L (ref 20–32)
Calcium: 9.6 mg/dL (ref 8.6–10.3)
Chloride: 105 mmol/L (ref 98–110)
Creat: 1.42 mg/dL — ABNORMAL HIGH (ref 0.60–1.35)
GFR, Est African American: 72 mL/min/{1.73_m2} (ref >=60)
GFR, Est Non African American: 62 mL/min/{1.73_m2} (ref >=60)
Globulin: 3.1 g/dL (calc) (ref 1.9–3.7)
Glucose, Bld: 94 mg/dL (ref 65–99)
Potassium: 5 mmol/L (ref 3.5–5.3)
Sodium: 143 mmol/L (ref 135–146)
Total Bilirubin: 1.1 mg/dL (ref 0.2–1.2)
Total Protein: 7.5 g/dL (ref 6.1–8.1)

## 2018-11-10 LAB — TEST AUTHORIZATION

## 2018-11-25 NOTE — Progress Notes (Signed)
   Subjective:    Patient ID: Charles Higgins, male    DOB: 1981/01/12, 38 y.o.   MRN: [id number]  HPI 38 year old Male seen today for health maintenance exam and evaluation of medical issues.  History of resistant hypertension and diabetes mellitus.  Last seen here in 2018.  Also seen in cardiology in 2018 for resistant hypertension.  At that time renal artery ultrasound and echo recommended but patient declined.  Cardiology change labetalol to [place].  Lasix was stopped and losartan was changed to Hyzaar.  Spironolactone was added.    Review of Systems denies chest pain or shortness of breath.  No syncope.  Social history: Married.  2 children.  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  Does not smoke or use alcohol.  Family history: Father with history of heart and renal disease.  Currently taking Coreg 12.5 mg twice daily, amlodipine 10 mg daily, Hyzaar 100/25 daily and clonidine 0.1 mg twice daily.  Needs Spironolactone and statin medication renewed.  Total cholesterol 249, LDL 190.  HDL 43 and triglycerides 60.  Hemoglobin A1c excellent at 5.7%.  Concern for chronic kidney disease.  His creatinine is 1.4 today.  He may need to be referred to [facility] for evaluation and needs to have ultrasound of the kidneys.  He will follow-up here in early August  Reminded about annual diabetic eye exam.        Objective:   Physical Exam Blood pressure 170/120.  Weight 248 pounds.  Pulse 78.  BMI 37.71.  Skin warm and dry.  Nodes none.  Neck is supple.  No JVD thyromegaly or carotid bruits.  Chest clear to auscultation.  Cardiac exam regular rate and rhythm normal S1 and S2 without murmurs or gallops.  Abdomen no hepatosplenomegaly masses or tenderness.  Genitalia exam deferred.  Trace lower extremity edema.  Neuro no focal deficits.         Assessment & Plan:  Impaired glucose tolerance-stable hemoglobin A1c at 5.7%  BMI 37.71-would benefit from  dietary counseling and exercise  Poor control of high blood pressure  Elevated creatinine-concern for chronic kidney disease  Pure hypercholesterolemia-start Crestor 5 mg daily  Plan: Refilled medications that he is now out of.  Start Crestor 5 mg 3 times a week and discontinue Zocor.  He has pure hypercholesterolemia needs follow-up early August.  May need referral to [facility]

## 2018-11-30 NOTE — Patient Instructions (Signed)
Restart Hyzaar.  Follow-up in early August.  Monitor blood pressure at home.  Take Crestor 5 mg daily.

## 2018-12-06 ENCOUNTER — Other Ambulatory Visit: Payer: 59 | Admitting: Internal Medicine

## 2018-12-07 ENCOUNTER — Ambulatory Visit: Payer: 59 | Admitting: Internal Medicine

## 2019-01-22 NOTE — Progress Notes (Signed)
Cardiology Office Note:    Date:  01/23/2019   ID:  Charles Higgins, DOB 1980/05/09, MRN [id number]  PCP:  Charles Mackintosh, MD  Cardiologist:  Charles Noe, MD  Electrophysiologist:  None   Referring MD: Charles Mackintosh, MD   Chief Complaint  Patient presents with  . Follow-up    Hypertension    History of Present Illness:    Charles Higgins is a 38 y.o. male with:  Hypertension, difficult to control  Patient has declined renal artery ultrasound and echocardiogram in the past  Diabetes mellitus, diet-controlled  Hyperlipidemia  Charles Higgins was last seen in May 2018.  He saw primary care in July 2020.  His blood pressure was markedly elevated.  He was out of his medications.  Cholesterol was also uncontrolled with LDL 190.  Creatinine was increased to 1.42.  His medications were resumed.  He may need referral to nephrology.  He is here alone today.  He notes that the medications for blood pressure make him feel poorly.  He sometimes skips medications for 1-2 days.  He takes morning medications at 12 noon and evening medications around 9 PM.  He has not had any medications yet today.  He has not had blurry vision.  He does note headaches if he does not take his medication.  He has not had chest pain, shortness of breath, syncope, leg swelling.   Prior CV studies:   The following studies were reviewed today:  None  Past Medical History:  Diagnosis Date  . Controlled diabetes mellitus type II without complication (HCC) 08/25/2013  . Elevated serum creatinine 08/25/2013  . Hyperlipidemia 06/23/2011  . Hypertension 03/10/2011  . Metabolic syndrome 06/28/2015  . Umbilical hernia 06/23/2011   Surgical Hx: The patient  has a past surgical history that includes No past surgeries.   Current Medications: Current Meds  Medication Sig  . amLODipine (NORVASC) 10 MG tablet Take 1 tablet (10 mg total) by mouth daily. Please make overdue yearly appt with Charles Higgins before anymore refills. 2nd  attempt  . losartan-hydrochlorothiazide (HYZAAR) 100-25 MG tablet Take 1 tablet by mouth daily.  . rosuvastatin (CRESTOR) 5 MG tablet TAKE ONE TABLET BY MOUTH THREE TIMES A WEEK  . simvastatin (ZOCOR) 20 MG tablet Take 1 tablet (20 mg total) by mouth at bedtime.  . [DISCONTINUED] carvedilol (COREG) 12.5 MG tablet Take 1 tablet (12.5 mg total) by mouth 2 (two) times daily. Please call and schedule an appt for further refills. 1st attempt  . [DISCONTINUED] cloNIDine (CATAPRES) 0.1 MG tablet Take 1 tablet (0.1 mg total) by mouth 2 (two) times daily.  . [DISCONTINUED] spironolactone (ALDACTONE) 25 MG tablet TAKE 1/2 (ONE-HALF) TABLET BY MOUTH  DAILY     Allergies:   Patient has no known allergies.   Social History   Tobacco Use  . Smoking status: Never Smoker  . Smokeless tobacco: Never Used  Substance Use Topics  . Alcohol use: No  . Drug use: No     Family Hx: The patient's family history includes CAD in his father; Diabetes in his father and maternal grandmother; Hypertension in his father and mother; Kidney failure in his father.  ROS:   Please see the history of present illness.    ROS All other systems reviewed and are negative.   EKGs/Labs/Other Test Reviewed:    EKG:  EKG is  ordered today.  The ekg ordered today demonstrates normal sinus rhythm, heart rate 65, normal axis, no  ST-T wave changes, QTC 420  Recent Labs: 11/08/2018: ALT 19; BUN 15; Creat 1.42; Hemoglobin 14.4; Platelets 227; Potassium 5.0; Sodium 143   Recent Lipid Panel Lab Results  Component Value Date/Time   CHOL 249 (H) 11/08/2018 01:08 PM   TRIG 60 11/08/2018 01:08 PM   HDL 43 11/08/2018 01:08 PM   CHOLHDL 5.8 (H) 11/08/2018 01:08 PM   LDLCALC 190 (H) 11/08/2018 01:08 PM    Physical Exam:    VS:  BP (!) 160/120   Pulse 65   Ht 5\' 8"  (1.727 m)   Wt 255 lb (115.7 kg)   BMI 38.77 kg/m     Wt Readings from Last 3 Encounters:  01/23/19 255 lb (115.7 kg)  11/09/18 248 lb (112.5 kg)  09/19/16 249  lb (112.9 kg)     Physical Exam  Constitutional: He is oriented to person, place, and time. He appears well-developed and well-nourished. No distress.  HENT:  Head: Normocephalic and atraumatic.  Eyes: No scleral icterus.  Neck: No JVD present. No thyromegaly present.  Cardiovascular: Normal rate, regular rhythm and normal heart sounds.  No murmur heard. Pulmonary/Chest: Effort normal and breath sounds normal. He has no rales.  Abdominal: Soft. There is no hepatomegaly.  Musculoskeletal:        General: No edema.  Lymphadenopathy:    He has no cervical adenopathy.  Neurological: He is alert and oriented to person, place, and time.  Skin: Skin is warm and dry.  Psychiatric: He has a normal mood and affect.    ASSESSMENT & PLAN:    1. Essential hypertension Blood pressure is markedly uncontrolled.  He is asymptomatic.  No evidence of end organ damage.  He does not take his medications on a consistent basis.  Side effects of the medications seem to be limiting his usage.  He also admits to a diet high in salt.  We discussed the importance of a low-sodium diet as well as take his medications on a consistent basis.  We discussed the dangers of uncontrolled hypertension including stroke, heart attack, heart failure, kidney failure requiring dialysis and death.  He understands this.  I think he may be having more side effects from clonidine than anything else.  Since he is taking this inconsistently, I suspect that this is causing spikes in his blood pressure when he skips it.  I also think he is having difficulty taking medications twice a day.  -DC Coreg  -DC spironolactone  -Decrease clonidine to once nightly for the next 2 nights, then stop  -Take losartan/HCTZ and amlodipine in the AM  -Start Toprol-XL 100 mg in the PM  -Follow-up with HTN clinic in 2 weeks  -Follow-up with me in 4 weeks  -Low-sodium diet  -BMET 1 week  2.  Renal insufficiency We discussed the dangers of proceeding  to end-stage renal disease if blood pressure is not controlled.  Repeat BMP in 1 week.  3. Controlled type 2 diabetes mellitus without complication, without long-term current use of insulin (HCC) Continue follow-up with primary care.  4. Hyperlipidemia  Continue statin Rx.    Dispo:  Return in about 4 weeks (around 02/20/2019) for Routine Follow Up, w/ Tereso Newcomer, PA-C.   Medication Adjustments/Labs and Tests Ordered: Current medicines are reviewed at length with the patient today.  Concerns regarding medicines are outlined above.  Tests Ordered: Orders Placed This Encounter  Procedures  . Basic Metabolic Panel (BMET)  . EKG 12-Lead   Medication Changes: Meds ordered this encounter  Medications  . metoprolol succinate (TOPROL-XL) 100 MG 24 hr tablet    Sig: Take 1 tablet 100 mg in the evening.    Dispense:  90 tablet    Refill:  3    Signed, Richardson Dopp, PA-C  01/23/2019 5:14 PM    [facility] [address], [place], [place]  [address] Phone: [phone]; Fax: [phone]

## 2019-01-23 ENCOUNTER — Other Ambulatory Visit: Payer: Self-pay

## 2019-01-23 ENCOUNTER — Ambulatory Visit (INDEPENDENT_AMBULATORY_CARE_PROVIDER_SITE_OTHER): Payer: 59 | Admitting: Physician Assistant

## 2019-01-23 ENCOUNTER — Encounter: Payer: Self-pay | Admitting: Physician Assistant

## 2019-01-23 VITALS — BP 160/120 | HR 65 | Ht 68.0 in | Wt 255.0 lb

## 2019-01-23 DIAGNOSIS — I10 Essential (primary) hypertension: Secondary | ICD-10-CM | POA: Diagnosis not present

## 2019-01-23 DIAGNOSIS — E119 Type 2 diabetes mellitus without complications: Secondary | ICD-10-CM

## 2019-01-23 DIAGNOSIS — E785 Hyperlipidemia, unspecified: Secondary | ICD-10-CM

## 2019-01-23 DIAGNOSIS — N289 Disorder of kidney and ureter, unspecified: Secondary | ICD-10-CM

## 2019-01-23 MED ORDER — METOPROLOL SUCCINATE ER 100 MG PO TB24: ORAL_TABLET | ORAL | 3 refills | Status: DC

## 2019-01-23 NOTE — Patient Instructions (Signed)
Medication Instructions:  STOP Spironolactone STOP Coreg Take Clonidine TONIGHT, TOMORROW NIGHT, THEN STOP TAKE Losartan and HCTZ in the Morning START Metoprolol Succinate 100 mg in the EVENING Take Amlodipine IN THE Morning If you need a refill on your cardiac medications before your next appointment, please call your pharmacy.   Lab work: 1 Engineering geologist If you have labs (blood work) drawn today and your tests are completely normal, you will receive your results only by: Marland Kitchen MyChart Message (if you have MyChart) OR . A paper copy in the mail If you have any lab test that is abnormal or we need to change your treatment, we will call you to review the results.  Testing/Procedures: NONE  Follow-Up: 4 WEEKS SCOTT WEAVER                      2 WEEKS HTN Clinic At [facility], you and your health needs are our priority.  As part of our continuing mission to provide you with exceptional heart care, we have created designated Provider Care Teams.  These Care Teams include your primary Cardiologist (physician) and Advanced Practice Providers (APPs -  Physician Assistants and Nurse Practitioners) who all work together to provide you with the care you need, when you need it. .   Any Other Special Instructions Will Be Listed Below (If Applicable).  DASH Eating Plan DASH stands for "Dietary Approaches to Stop Hypertension." The DASH eating plan is a healthy eating plan that has been shown to reduce high blood pressure (hypertension). It may also reduce your risk for type 2 diabetes, heart disease, and stroke. The DASH eating plan may also help with weight loss. What are tips for following this plan?  General guidelines  Avoid eating more than 2,300 mg (milligrams) of salt (sodium) a day. If you have hypertension, you may need to reduce your sodium intake to 1,500 mg a day.  Limit alcohol intake to no more than 1 drink a day for nonpregnant women and 2 drinks a day for men. One drink equals 12 oz  of beer, 5 oz of wine, or 1 oz of hard liquor.  Work with your health care provider to maintain a healthy body weight or to lose weight. Ask what an ideal weight is for you.  Get at least 30 minutes of exercise that causes your heart to beat faster (aerobic exercise) most days of the week. Activities may include walking, swimming, or biking.  Work with your health care provider or diet and nutrition specialist (dietitian) to adjust your eating plan to your individual calorie needs. Reading food labels   Check food labels for the amount of sodium per serving. Choose foods with less than 5 percent of the Daily Value of sodium. Generally, foods with less than 300 mg of sodium per serving fit into this eating plan.  To find whole grains, look for the word "whole" as the first word in the ingredient list. Shopping  Buy products labeled as "low-sodium" or "no salt added."  Buy fresh foods. Avoid canned foods and premade or frozen meals. Cooking  Avoid adding salt when cooking. Use salt-free seasonings or herbs instead of table salt or sea salt. Check with your health care provider or pharmacist before using salt substitutes.  Do not fry foods. Cook foods using healthy methods such as baking, boiling, grilling, and broiling instead.  Cook with heart-healthy oils, such as olive, canola, soybean, or sunflower oil. Meal planning  Eat a balanced diet  that includes: ? 5 or more servings of fruits and vegetables each day. At each meal, try to fill half of your plate with fruits and vegetables. ? Up to 6-8 servings of whole grains each day. ? Less than 6 oz of lean meat, poultry, or fish each day. A 3-oz serving of meat is about the same size as a deck of cards. One egg equals 1 oz. ? 2 servings of low-fat dairy each day. ? A serving of nuts, seeds, or beans 5 times each week. ? Heart-healthy fats. Healthy fats called Omega-3 fatty acids are found in foods such as flaxseeds and coldwater fish,  like sardines, salmon, and mackerel.  Limit how much you eat of the following: ? Canned or prepackaged foods. ? Food that is high in trans fat, such as fried foods. ? Food that is high in saturated fat, such as fatty meat. ? Sweets, desserts, sugary drinks, and other foods with added sugar. ? Full-fat dairy products.  Do not salt foods before eating.  Try to eat at least 2 vegetarian meals each week.  Eat more home-cooked food and less restaurant, buffet, and fast food.  When eating at a restaurant, ask that your food be prepared with less salt or no salt, if possible. What foods are recommended? The items listed may not be a complete list. Talk with your dietitian about what dietary choices are best for you. Grains Whole-grain or whole-wheat bread. Whole-grain or whole-wheat pasta. Brown rice. Orpah Cobb. Bulgur. Whole-grain and low-sodium cereals. Pita bread. Low-fat, low-sodium crackers. Whole-wheat flour tortillas. Vegetables Fresh or frozen vegetables (raw, steamed, roasted, or grilled). Low-sodium or reduced-sodium tomato and vegetable juice. Low-sodium or reduced-sodium tomato sauce and tomato paste. Low-sodium or reduced-sodium canned vegetables. Fruits All fresh, dried, or frozen fruit. Canned fruit in natural juice (without added sugar). Meat and other protein foods Skinless chicken or [place]. Ground chicken or [place]. Pork with fat trimmed off. Fish and seafood. Egg whites. Dried beans, peas, or lentils. Unsalted nuts, nut butters, and seeds. Unsalted canned beans. Lean cuts of beef with fat trimmed off. Low-sodium, lean deli meat. Dairy Low-fat (1%) or fat-free (skim) milk. Fat-free, low-fat, or reduced-fat cheeses. Nonfat, low-sodium ricotta or cottage cheese. Low-fat or nonfat yogurt. Low-fat, low-sodium cheese. Fats and oils Soft margarine without trans fats. Vegetable oil. Low-fat, reduced-fat, or light mayonnaise and salad dressings (reduced-sodium). Canola,  safflower, olive, soybean, and sunflower oils. Avocado. Seasoning and other foods Herbs. Spices. Seasoning mixes without salt. Unsalted popcorn and pretzels. Fat-free sweets. What foods are not recommended? The items listed may not be a complete list. Talk with your dietitian about what dietary choices are best for you. Grains Baked goods made with fat, such as croissants, muffins, or some breads. Dry pasta or rice meal packs. Vegetables Creamed or fried vegetables. Vegetables in a cheese sauce. Regular canned vegetables (not low-sodium or reduced-sodium). Regular canned tomato sauce and paste (not low-sodium or reduced-sodium). Regular tomato and vegetable juice (not low-sodium or reduced-sodium). Rosita Fire. Olives. Fruits Canned fruit in a light or heavy syrup. Fried fruit. Fruit in cream or butter sauce. Meat and other protein foods Fatty cuts of meat. Ribs. Fried meat. Tomasa Blase. Sausage. Bologna and other processed lunch meats. Salami. Fatback. Hotdogs. Bratwurst. Salted nuts and seeds. Canned beans with added salt. Canned or smoked fish. Whole eggs or egg yolks. Chicken or [place] with skin. Dairy Whole or 2% milk, cream, and half-and-half. Whole or full-fat cream cheese. Whole-fat or sweetened yogurt. Full-fat cheese. Nondairy creamers.  Whipped toppings. Processed cheese and cheese spreads. Fats and oils Butter. Stick margarine. Lard. Shortening. Ghee. Bacon fat. Tropical oils, such as coconut, palm kernel, or palm oil. Seasoning and other foods Salted popcorn and pretzels. Onion salt, garlic salt, seasoned salt, table salt, and sea salt. Worcestershire sauce. Tartar sauce. Barbecue sauce. Teriyaki sauce. Soy sauce, including reduced-sodium. Steak sauce. Canned and packaged gravies. Fish sauce. Oyster sauce. Cocktail sauce. Horseradish that you find on the shelf. Ketchup. Mustard. Meat flavorings and tenderizers. Bouillon cubes. Hot sauce and Tabasco sauce. Premade or packaged marinades. Premade or  packaged taco seasonings. Relishes. Regular salad dressings. Where to find more information:  National Heart, Lung, and [place]: https://wilson-eaton.com/  American Heart Association: www.heart.org Summary  The DASH eating plan is a healthy eating plan that has been shown to reduce high blood pressure (hypertension). It may also reduce your risk for type 2 diabetes, heart disease, and stroke.  With the DASH eating plan, you should limit salt (sodium) intake to 2,300 mg a day. If you have hypertension, you may need to reduce your sodium intake to 1,500 mg a day.  When on the DASH eating plan, aim to eat more fresh fruits and vegetables, whole grains, lean proteins, low-fat dairy, and heart-healthy fats.  Work with your health care provider or diet and nutrition specialist (dietitian) to adjust your eating plan to your individual calorie needs. This information is not intended to replace advice given to you by your health care provider. Make sure you discuss any questions you have with your health care provider. Document Released: 04/07/2011 Document Revised: 03/31/2017 Document Reviewed: 04/11/2016 Elsevier Patient Education  2020 [employer].

## 2019-01-31 ENCOUNTER — Other Ambulatory Visit: Payer: Self-pay

## 2019-01-31 ENCOUNTER — Telehealth: Payer: Self-pay

## 2019-01-31 ENCOUNTER — Other Ambulatory Visit: Payer: 59 | Admitting: *Deleted

## 2019-01-31 DIAGNOSIS — I10 Essential (primary) hypertension: Secondary | ICD-10-CM

## 2019-01-31 DIAGNOSIS — E119 Type 2 diabetes mellitus without complications: Secondary | ICD-10-CM

## 2019-01-31 LAB — BASIC METABOLIC PANEL
BUN/Creatinine Ratio: 12 (ref 9–20)
BUN: 16 mg/dL (ref 6–20)
CO2: 25 mmol/L (ref 20–29)
Calcium: 9.1 mg/dL (ref 8.7–10.2)
Chloride: 101 mmol/L (ref 96–106)
Creatinine, Ser: 1.29 mg/dL — ABNORMAL HIGH (ref 0.76–1.27)
GFR calc Af Amer: 81 mL/min/{1.73_m2} (ref >59)
GFR calc non Af Amer: 70 mL/min/{1.73_m2} (ref >59)
Glucose: 115 mg/dL — ABNORMAL HIGH (ref 65–99)
Potassium: 3.9 mmol/L (ref 3.5–5.2)
Sodium: 140 mmol/L (ref 134–144)

## 2019-01-31 NOTE — Telephone Encounter (Signed)
Notes recorded by Frederik Schmidt, RN on 01/31/2019 at 4:46 PM EDT  The patient has been notified of the result and verbalized understanding. All questions (if any) were answered.  Frederik Schmidt, RN 01/31/2019 4:46 PM

## 2019-01-31 NOTE — Telephone Encounter (Signed)
-----   Message from Liliane Shi, [place] sent at 01/31/2019  4:41 PM EDT ----- Creatinine stable.  K+ normal.   PLAN:   -Continue current medications and follow up as planned.  Richardson Dopp, PA-C    01/31/2019 4:41 PM

## 2019-02-05 NOTE — Progress Notes (Unsigned)
Patient ID: QUINTAVIOUS RINCK                 DOB: 1980-10-20                      MRN: [id number]     HPI: Charles Higgins is a 38 y.o. male referred by Dr. Marland Kitchen to HTN clinic. PMH is significant for uncontrolled hypertension, DM, and HLD. Pt has low compliance with his meds, skips meds for 1-2 days; states his BP meds make him feel poorly - side effects of the medications seem to be limiting his usage, mainly with clonidine. Of note, pt may have difficulty taking meds twice a day.   During his last visit on 01/23/19, his BP was above goal at 160/120. Spironolactone and carvedilol was discontinued; clonidine was decreased to once nightly for the following two nights and then discontinued; Toprol XL was initiated; and Losartan-HCTZ and amlodipine were continued.   Patient presents today in good spirits?? for a HTN follow-up.   Today's BP in clinic was .... Compliance with meds? Took meds this morning?? Home BP readings? Blurry vision? Headaches? Diet? admits to a diet high in salt Exercise?  Current HTN meds: Amlodipine 10 mg daily AM, Losartan-HCTZ 100-25 daily AM, Toprol XL 100 mg daily PM  Previously tried: Spironolactone 12.5 mg daily, carvedilol 12.5 mg BID, clonidine 0.1 mg BID   BP goal: < 130/80 (ACC/AHA)  Family History: CAD in his father; Diabetes in his father and maternal grandmother; Hypertension in his father and mother; Kidney failure in his father.  Social History: Not a tobacco smoker  Diet:   Exercise:   Home BP readings:   Wt Readings from Last 3 Encounters:  01/23/19 255 lb (115.7 kg)  11/09/18 248 lb (112.5 kg)  09/19/16 249 lb (112.9 kg)   BP Readings from Last 3 Encounters:  01/23/19 (!) 160/120  11/09/18 (!) 170/120  09/19/16 138/82   Pulse Readings from Last 3 Encounters:  01/23/19 65  11/09/18 78  09/19/16 68    Renal function: Estimated Creatinine Clearance: 95.9 mL/min (A) (by C-G formula based on SCr of 1.29 mg/dL (H)).  Past Medical History:   Diagnosis Date  . Controlled diabetes mellitus type II without complication (HCC) 08/25/2013  . Elevated serum creatinine 08/25/2013  . Hyperlipidemia 06/23/2011  . Hypertension 03/10/2011  . Metabolic syndrome 06/28/2015  . Umbilical hernia 06/23/2011    Current Outpatient Medications on File Prior to Visit  Medication Sig Dispense Refill  . amLODipine (NORVASC) 10 MG tablet Take 1 tablet (10 mg total) by mouth daily. Please make overdue yearly appt with Dr. Katrinka Blazing before anymore refills. 2nd attempt 90 tablet 1  . losartan-hydrochlorothiazide (HYZAAR) 100-25 MG tablet Take 1 tablet by mouth daily. 90 tablet 1  . metoprolol succinate (TOPROL-XL) 100 MG 24 hr tablet Take 1 tablet 100 mg in the evening. 90 tablet 3  . rosuvastatin (CRESTOR) 5 MG tablet TAKE ONE TABLET BY MOUTH THREE TIMES A WEEK 30 tablet 1  . simvastatin (ZOCOR) 20 MG tablet Take 1 tablet (20 mg total) by mouth at bedtime. 90 tablet 3   No current facility-administered medications on file prior to visit.     No Known Allergies   Assessment/Plan:  1. Hypertension -   Increase toprol XL to 200 mg daily --> 100 mg BID?? (max dose is 400 mg daily)  Spironolactone 25 mg daily  - K 3.9 , Scr .1.29, CrCl > 30  Continue ??  Amlodipine 10 mg daily AM, Losartan-HCTZ 100-25 daily AM, Toprol XL 100 mg daily PM  Follow-up in HTN clinic in .Marland KitchenMarland Kitchen

## 2019-02-06 ENCOUNTER — Ambulatory Visit: Payer: 59

## 2019-02-11 NOTE — Progress Notes (Signed)
Patient ID: Charles Higgins                 DOB: [birth year]                      MRN: [id number]     HPI: Charles Higgins is a 38 y.o. male patient of Dr. Tamala Julian referred by Richardson Dopp, PA to HTN clinic. PMH is significant for HTN, DM, HLD. Patient was last seen by Richardson Dopp on 01/23/2019 for uncontrolled hypertension and his clinic BP was elevated at 160/120 mmHg. Carvedilol was discontinued and Toprol XL 100 mg daily at bedtime was initiated. Spironolactone was discontinued; clonidine was decreased to once nightly and then discontinued; losartan/HCTZ and amlodipine were continued.   At last visit, he occasionally skips taking his medications for 1-2 days and has difficulty taking medications twice a day. Pt states they make him feel "poorly" especially with clonidine. Of note, patient's Scr decreased from 1.42 to 1.29 (01/31/2019)  Patient presents in good spirits today for a HTN follow-up. In clinic BP was elevated at 164/100 mmHg. He reports not taking his Amlodipine and Losartan/HCTZ this morning, however did take Toprol XL last night. He states since his last visit with Kathlen Mody, he has not missed any doses and usually takes his meds at 9AM and 9PM each day. Patient does report some blurry vision when looking at his computer screen at work and will schedule an appointment with the eye doctor to check his vision. Patient denies headaches and dizziness. Patient discussed that his family is his motivation to live a healthier life and will continue to do what's needed to managed his blood pressure. Patient does occasionally eat fast food and drinks sweet tea but he is trying his best to cut out salt from his diet and eat more vegetables. He has not started exercising yet but will start this week.   Current HTN meds: Amlodipine 10 mg daily, Losartan-HCTZ 100-25 mg daily, Toprol XL 100 mg daily   Previously tried: Clonidine 0.1 mg BID (fatigue, rebound hypertension due to non-compliance), Lisinopril 20 mg  daily, Lisinopril-HCTZ 20-25 mg daily, Spironolactone 25 mg daily, Losartan 100 mg daily, Labetalol 300 mg BID, Carvedilol 12.5 mg BID   BP goal: < 130/80 mmHg  Family History: The patient's family history includes CAD in his father; Diabetes in his father and maternal grandmother; Hypertension in his father and mother; Kidney failure in his father.  Social History: denies tobacco use  Diet:  breakfast - rarely eats, lunch - sandwhich, fast food, trying to cut out salt and eat more vegetables - broccoli, sweet tea, propell flavored water  Exercise: has not started exercising yet  Home BP readings: same day/time readings of 142/90 and 105/80 - readings are likely inaccurate due to BP technique at home  Wt Readings from Last 3 Encounters:  01/23/19 255 lb (115.7 kg)  11/09/18 248 lb (112.5 kg)  09/19/16 249 lb (112.9 kg)   BP Readings from Last 3 Encounters:  01/23/19 (!) 160/120  11/09/18 (!) 170/120  09/19/16 138/82   Pulse Readings from Last 3 Encounters:  01/23/19 65  11/09/18 78  09/19/16 68    Renal function: CrCl cannot be calculated (Unknown ideal weight.).  Past Medical History:  Diagnosis Date  . Controlled diabetes mellitus type II without complication ([place]) 09/04/3974  . Elevated serum creatinine 08/25/2013  . Hyperlipidemia 06/23/2011  . Hypertension 03/10/2011  . Metabolic syndrome 7/34/1937  . Umbilical hernia 01/02/4096  Current Outpatient Medications on File Prior to Visit  Medication Sig Dispense Refill  . amLODipine (NORVASC) 10 MG tablet Take 1 tablet (10 mg total) by mouth daily. Please make overdue yearly appt with Dr. Katrinka Blazing before anymore refills. 2nd attempt 90 tablet 1  . losartan-hydrochlorothiazide (HYZAAR) 100-25 MG tablet Take 1 tablet by mouth daily. 90 tablet 1  . metoprolol succinate (TOPROL-XL) 100 MG 24 hr tablet Take 1 tablet 100 mg in the evening. 90 tablet 3  . rosuvastatin (CRESTOR) 5 MG tablet TAKE ONE TABLET BY MOUTH THREE TIMES A  WEEK 30 tablet 1  . simvastatin (ZOCOR) 20 MG tablet Take 1 tablet (20 mg total) by mouth at bedtime. 90 tablet 3   No current facility-administered medications on file prior to visit.     No Known Allergies   Assessment/Plan:  1. Hypertension - BP today was above goal < 130/80. Restart Spironolactone 12.5 mg daily in the morning. Patient still has Spironolactone 25 mg tablets at home and was instructed to cut tablets in half. Switch Amlodipine 10 mg daily to the evening. Continue Losartan-HCTZ 100-25 mg daily in the morning and Toprol XL 100 mg daily in the evening. Recommended patient to take his morning BP meds at least 1 hour prior to visit to accurately assess his in clinic BP. Encouraged patient to check his BP at least one time every day and bring these readings along with his BP cuff to his next visit. Discussed the importance of a low-carb, low-salt diet and set an exercise goal of walking 15-20 mins every other day with the ultimate goal of exercising 30 mins every day.   Patient has a scheduled appointment with Tereso Newcomer, PA in 2 weeks - will use this as a follow-up for HTN management. Pharmacy will be happy to see him back in clinic if HTN is still uncontrolled.

## 2019-02-12 ENCOUNTER — Other Ambulatory Visit: Payer: Self-pay

## 2019-02-12 ENCOUNTER — Ambulatory Visit (INDEPENDENT_AMBULATORY_CARE_PROVIDER_SITE_OTHER): Payer: 59 | Admitting: Pharmacist

## 2019-02-12 VITALS — BP 164/100 | HR 60

## 2019-02-12 DIAGNOSIS — I10 Essential (primary) hypertension: Secondary | ICD-10-CM | POA: Diagnosis not present

## 2019-02-12 NOTE — Patient Instructions (Addendum)
Nice meeting you today!  Start Spironolactone 12.5 mg daily in the morning (cut the 25 mg tablet in half)  Start taking Amlodipine 10 mg daily in the evening  Continue Losartan-HCTZ 100-25 mg daily in the morning, Toprol XL 100 mg daily in the evening  Check your BP at least one time everyday and bring those readings along with your BP cuff to your next visit.  Continue to eat a low-carb, low-salt diet and incorporate exercising 15-20 mins every other day.

## 2019-02-25 ENCOUNTER — Encounter: Payer: Self-pay | Admitting: Interventional Cardiology

## 2019-02-26 ENCOUNTER — Ambulatory Visit: Payer: 59 | Admitting: Physician Assistant

## 2019-02-28 ENCOUNTER — Telehealth: Payer: Self-pay

## 2019-02-28 NOTE — Telephone Encounter (Signed)
Left message needs to come in January for 6 month f/u, needs A1C and BMET prior.

## 2019-03-08 ENCOUNTER — Telehealth: Payer: 59 | Admitting: Physician Assistant

## 2019-03-09 DIAGNOSIS — M545 Low back pain: Secondary | ICD-10-CM | POA: Diagnosis not present

## 2019-03-09 DIAGNOSIS — I10 Essential (primary) hypertension: Secondary | ICD-10-CM | POA: Diagnosis not present

## 2019-05-06 ENCOUNTER — Other Ambulatory Visit: Payer: Self-pay | Admitting: Internal Medicine

## 2019-05-06 DIAGNOSIS — E785 Hyperlipidemia, unspecified: Secondary | ICD-10-CM

## 2019-05-06 DIAGNOSIS — I10 Essential (primary) hypertension: Secondary | ICD-10-CM

## 2020-01-09 DIAGNOSIS — Z20822 Contact with and (suspected) exposure to covid-19: Secondary | ICD-10-CM | POA: Diagnosis not present

## 2020-02-25 ENCOUNTER — Telehealth: Payer: Self-pay | Admitting: Internal Medicine

## 2020-02-25 ENCOUNTER — Telehealth: Payer: Self-pay | Admitting: Interventional Cardiology

## 2020-02-25 DIAGNOSIS — I10 Essential (primary) hypertension: Secondary | ICD-10-CM

## 2020-02-25 MED ORDER — SPIRONOLACTONE 25 MG PO TABS: 12.5000 mg | ORAL_TABLET | Freq: Every day | ORAL | 0 refills | Status: DC

## 2020-02-25 MED ORDER — METOPROLOL SUCCINATE ER 100 MG PO TB24: ORAL_TABLET | ORAL | 0 refills | Status: DC

## 2020-02-25 MED ORDER — AMLODIPINE BESYLATE 10 MG PO TABS: 10.0000 mg | ORAL_TABLET | Freq: Every day | ORAL | 0 refills | Status: DC

## 2020-02-25 MED ORDER — LOSARTAN POTASSIUM-HCTZ 100-25 MG PO TABS: 1.0000 | ORAL_TABLET | Freq: Every day | ORAL | 0 refills | Status: DC

## 2020-02-25 NOTE — Telephone Encounter (Signed)
He is a patient of [facility] and most recently saw Tereso Newcomer, PA. All he has to do is call and request another provider. He may need to speak with office manager.

## 2020-02-25 NOTE — Telephone Encounter (Signed)
Charles Higgins [phone]  [name] called to say he would like a referral to cardiologist Dr Reatha Harps for a second opinion, he does not feel like who he was seeing was helping him and he currently is not taking any of his blood pressure medications. I reminded him he had not been seen here since 10/2018 and that he may need a more current appointment for a referral, and his CPE was due 10/2019 and he missed his last follow up in 12/2018.

## 2020-02-25 NOTE — Telephone Encounter (Signed)
Pt's medications were sent to pt's pharmacy as requested. Confirmation received.  

## 2020-02-25 NOTE — Telephone Encounter (Signed)
Let pt know he would need to speak with office manager, and see about changing doctors in the same practice.

## 2020-02-25 NOTE — Telephone Encounter (Signed)
°*  STAT* If patient is at the pharmacy, call can be transferred to refill team.   1. Which medications need to be refilled? (please list name of each medication and dose if known)  amLODipine (NORVASC) 10 MG tablet losartan-hydrochlorothiazide (HYZAAR) 100-25 MG tablet metoprolol succinate (TOPROL-XL) 100 MG 24 hr tablet spironolactone (ALDACTONE) 25 MG tablet  2. Which pharmacy/location (including street and city if local pharmacy) is medication to be sent to?  [id number] - [place], [place] - [address]  3. Do they need a 30 day or 90 day supply?  90 day

## 2020-02-25 NOTE — Telephone Encounter (Signed)
LVM to CB.

## 2020-05-07 ENCOUNTER — Ambulatory Visit (INDEPENDENT_AMBULATORY_CARE_PROVIDER_SITE_OTHER): Payer: No Typology Code available for payment source | Admitting: Internal Medicine

## 2020-05-07 ENCOUNTER — Other Ambulatory Visit: Payer: Self-pay

## 2020-05-07 ENCOUNTER — Telehealth: Payer: Self-pay | Admitting: Internal Medicine

## 2020-05-07 VITALS — Temp 99.0°F

## 2020-05-07 DIAGNOSIS — J189 Pneumonia, unspecified organism: Secondary | ICD-10-CM | POA: Diagnosis not present

## 2020-05-07 DIAGNOSIS — R059 Cough, unspecified: Secondary | ICD-10-CM | POA: Diagnosis not present

## 2020-05-07 DIAGNOSIS — Z20822 Contact with and (suspected) exposure to covid-19: Secondary | ICD-10-CM

## 2020-05-07 MED ORDER — ALBUTEROL SULFATE HFA 108 (90 BASE) MCG/ACT IN AERS: 2.0000 | INHALATION_SPRAY | Freq: Four times a day (QID) | RESPIRATORY_TRACT | 2 refills | Status: DC | PRN

## 2020-05-07 MED ORDER — DOXYCYCLINE HYCLATE 100 MG PO TABS: 100.0000 mg | ORAL_TABLET | Freq: Two times a day (BID) | ORAL | 0 refills | Status: DC

## 2020-05-07 MED ORDER — PREDNISONE 10 MG PO TABS: ORAL_TABLET | ORAL | 0 refills | Status: DC

## 2020-05-07 NOTE — Progress Notes (Addendum)
   Subjective:    Patient ID: Charles Higgins, male    DOB: [birth year], 40 y.o.   MRN: [id number]  HPI 40 year old Male seen today with protracted cough since late December. He was last seen here in December 2020. He has history of Diabetes but last Hgb AIC in July 2020 was 5.7%.Says he has no symptoms of diabetes at the present time. His entire family has had respiratory infection symptoms.  They were at a family gathering around Christmas.  His mother-in-law tested positive for COVID-19 around that time.  Patient and his wife were tested for Covid-19  around December 27 and December 30 but have not gotten the results.  Wife says husband is coughing and gets winded when he goes upstairs and is weak.  He has a history of difficult to control high blood pressure and is under the care of [facility].  History of chronic kidney disease due to hypertension.  Last basic metabolic panel obtained by cardiologist was October 2020 and was 1.29.  Creatinine has been as high as 1.42 in July 2020.  Records indicate he has had 2 COVID-19 immunizations.    Review of Systems patient has no nausea or vomiting.  Says he staying well-hydrated.     Objective:   Physical Exam  Patient looks fatigued.  Seems mildly short of breath.  Respiratory rate 12.  TMs are clear.  He has rales in his right lower lobe.  No wheezing.  COVID-19 PCR test obtained and results are pending.      Assessment & Plan:  I am concerned he has lower lobe pneumonia.  I hear rales in right lower lobe.  He will go to [facility] for chest x-ray.  I am also concerned that he has COVID-19 virus infection.  Results of PCR nasal swab testing are pending.  Plan: I am placing him on doxycycline 100 mg twice daily for 10 days.  He will be placed on prednisone 10 mg to take in tapering course starting with 60 mg daily and decreasing by 10 mg daily i.e. 6-5-4-3-2-1 taper.  He was given an albuterol inhaler to use 2 sprays 4 times a day.  Stay  inside rest and drink plenty of fluids.  Monitor pulse oximetry.  Addendum: Chest x-ray shows left lower lobe pneumonia.  He will need follow-up in 2 weeks.  He is to quarantine until test results for Covid are back and rest at home.  Drink plenty of fluids.  Take medications as directed.  Time spent with patient, reviewing chart , placing orders and E scribed medication in addition to  medical decision making is 20 minutes

## 2020-05-07 NOTE — Telephone Encounter (Signed)
Car visit this afternoon. They need a pulse oximeter from drug store to check his O2 level

## 2020-05-07 NOTE — Telephone Encounter (Signed)
Scheduled Car Visit °

## 2020-05-07 NOTE — Telephone Encounter (Signed)
Charles Higgins [phone]  [name] called to say her husband is coughing and really gets winded when going up stairs, still weak. They were tested for COVID on 04/27/20 and 04/30/20 and have not got results. Her Mom was with them and is positive for COVID.

## 2020-05-08 ENCOUNTER — Ambulatory Visit (HOSPITAL_COMMUNITY)
Admission: RE | Admit: 2020-05-08 | Discharge: 2020-05-08 | Disposition: A | Discharge status: Home / Self Care | Payer: No Typology Code available for payment source | Source: Ambulatory Visit | Attending: Internal Medicine | Admitting: Internal Medicine

## 2020-05-08 ENCOUNTER — Other Ambulatory Visit: Payer: Self-pay

## 2020-05-08 DIAGNOSIS — R059 Cough, unspecified: Secondary | ICD-10-CM | POA: Diagnosis not present

## 2020-05-08 DIAGNOSIS — Z20822 Contact with and (suspected) exposure to covid-19: Secondary | ICD-10-CM | POA: Diagnosis not present

## 2020-05-08 DIAGNOSIS — J189 Pneumonia, unspecified organism: Secondary | ICD-10-CM | POA: Insufficient documentation

## 2020-05-08 DIAGNOSIS — R509 Fever, unspecified: Secondary | ICD-10-CM | POA: Diagnosis not present

## 2020-05-08 DIAGNOSIS — R0989 Other specified symptoms and signs involving the circulatory and respiratory systems: Secondary | ICD-10-CM | POA: Diagnosis not present

## 2020-05-08 DIAGNOSIS — I10 Essential (primary) hypertension: Secondary | ICD-10-CM | POA: Diagnosis not present

## 2020-05-08 DIAGNOSIS — R918 Other nonspecific abnormal finding of lung field: Secondary | ICD-10-CM | POA: Diagnosis not present

## 2020-05-09 ENCOUNTER — Encounter: Payer: Self-pay | Admitting: Internal Medicine

## 2020-05-09 NOTE — Patient Instructions (Signed)
You have been diagnosed on chest x-ray is having left lower lobe pneumonia.  COVID-19 test results are pending.  Rest and drink plenty of fluids.  Quarantine at home.  Take doxycycline 100 mg twice daily for 10 days.  Start tapering course of prednisone starting with 60 mg day one and decreasing by 10 mg daily.  You will need follow-up office visit and chest x-ray in 2 weeks.  Use albuterol inhaler 2 sprays 4 times a day.  Monitor pulse oximetry at home.

## 2020-05-10 ENCOUNTER — Encounter: Payer: Self-pay | Admitting: Internal Medicine

## 2020-05-10 ENCOUNTER — Telehealth: Payer: Self-pay | Admitting: Internal Medicine

## 2020-05-10 LAB — SARS-COV-2 RNA,(COVID-19) QUALITATIVE NAAT: SARS CoV2 RNA: DETECTED — AB

## 2020-05-10 NOTE — Telephone Encounter (Signed)
Left message around 4pm with Covid results and asked pt. to call me at office.

## 2020-05-10 NOTE — Telephone Encounter (Signed)
Spoke with patient by phone today. His Covid-19 test is positive. He gets SOB going upstairs and has pneumonia LLL based on recent CXR.he is on Doxycycline and prednisone. Wife says his pulse ox is stable at rest. She and their children are ill as well. He has appt to repeat CXR and see me Jan 17. Out of work until then.

## 2020-05-11 ENCOUNTER — Encounter: Payer: Self-pay | Admitting: Internal Medicine

## 2020-05-11 ENCOUNTER — Telehealth: Payer: Self-pay | Admitting: Internal Medicine

## 2020-05-11 NOTE — Telephone Encounter (Signed)
Faxed Positive COVID-19 lab results to [facility] [phone], phone number [phone] 

## 2020-05-19 ENCOUNTER — Other Ambulatory Visit: Payer: Self-pay

## 2020-05-19 ENCOUNTER — Ambulatory Visit (INDEPENDENT_AMBULATORY_CARE_PROVIDER_SITE_OTHER): Payer: No Typology Code available for payment source | Admitting: Internal Medicine

## 2020-05-19 ENCOUNTER — Encounter: Payer: Self-pay | Admitting: Internal Medicine

## 2020-05-19 ENCOUNTER — Ambulatory Visit (HOSPITAL_COMMUNITY)
Admission: RE | Admit: 2020-05-19 | Discharge: 2020-05-19 | Disposition: A | Discharge status: Home / Self Care | Payer: No Typology Code available for payment source | Source: Ambulatory Visit | Attending: Internal Medicine | Admitting: Internal Medicine

## 2020-05-19 ENCOUNTER — Ambulatory Visit: Payer: No Typology Code available for payment source | Admitting: Internal Medicine

## 2020-05-19 VITALS — BP 140/90 | HR 63 | Temp 98.6°F | Ht 68.0 in | Wt 246.0 lb

## 2020-05-19 DIAGNOSIS — J189 Pneumonia, unspecified organism: Secondary | ICD-10-CM

## 2020-05-19 DIAGNOSIS — E785 Hyperlipidemia, unspecified: Secondary | ICD-10-CM | POA: Diagnosis not present

## 2020-05-19 DIAGNOSIS — U071 COVID-19: Secondary | ICD-10-CM

## 2020-05-19 DIAGNOSIS — I10 Essential (primary) hypertension: Secondary | ICD-10-CM

## 2020-05-19 DIAGNOSIS — R7302 Impaired glucose tolerance (oral): Secondary | ICD-10-CM | POA: Diagnosis not present

## 2020-05-19 MED ORDER — ROSUVASTATIN CALCIUM 5 MG PO TABS
ORAL_TABLET | ORAL | 1 refills | Status: DC
Start: 2020-05-19 — End: 2021-05-11

## 2020-05-20 NOTE — Progress Notes (Signed)
   Subjective:    Patient ID: UNO ESAU, male    DOB: 16-Jan-1981, 40 y.o.   MRN: [id number]  HPI 40 year old Male seen for follow up on Covid-19 infection associated with left lower lobe pneumonia.  He was diagnosed with left lower lobe pneumonia on January 7.  He has been treated with antibiotics and has been out of work.  He is beginning to feel better.  Says his pulse oximetry readings have been stable and within normal limits.  He has multiple medical issues/comorbid conditions including essential hypertension, hyperlipidemia and impaired glucose tolerance.  He is also overweight.  He had repeat chest x-ray today showing resolution of left lower lobe pneumonia.  Was treated with doxycycline 100 mg twice daily for 10 days and he will need to finish that prescription.  Because of shortness of breath and mild hypoxemia he was given a short course of prednisone starting with 60 mg day 1 and decreasing by 10 mg daily i.e. 6-5-4-3-2-1 taper.    Review of Systems see above-no nausea vomiting dysgeusia or headache.  Cough has improved.  Over the weekend was short of breath going up stairs.  This has improved.     Objective:   Physical Exam Blood pressure 140/90, pulse 63, temperature 98.6 degrees pulse oximetry 97% weight 246 pounds BMI 37.40.  Seen today in no acute distress with normal respiratory rate.  Neck is supple.  Chest is clear to auscultation without rales or wheezing.       Assessment & Plan:  History of COVID-19 virus infection with associated left lower lobe pneumonia  Impaired glucose tolerance  BMI 37  Essential hypertension  Hyperlipidemia-statin refilled  Plan: Chest x-ray shows resolution of left lower lobe infiltrate.  He is feeling much better.  Note given to return to work.  He should take it easy and continue to walk for exercise and monitor his pulse oximetry.  Call if has any concerns or questions.

## 2020-05-20 NOTE — Patient Instructions (Addendum)
We are glad you are feeling better.  Chest x-ray has cleared.  Finish course of antibiotics.  Walk 20 minutes a couple of times a day and monitor pulse oximetry.  This will help with shortness of breath associated with COVID and pneumonia.  Note given to return to work.

## 2020-05-25 ENCOUNTER — Telehealth: Payer: Self-pay | Admitting: Internal Medicine

## 2020-05-25 DIAGNOSIS — Z029 Encounter for administrative examinations, unspecified: Secondary | ICD-10-CM

## 2020-05-25 NOTE — Telephone Encounter (Signed)
[employer] dropped off FMLA forms to be completed

## 2020-05-25 NOTE — Telephone Encounter (Signed)
Completed and faxed FMLA forms to [employer] [employer] [facility] Fax [phone] along with Office notes 05/07/2020 and 05/19/2020, lab results and xray results.  Case # [id number]

## 2020-05-26 NOTE — Progress Notes (Deleted)
Cardiology Office Note:    Date:  05/26/2020   ID:  Charles Higgins, DOB 10/17/1980, MRN [id number]  PCP:  Margaree Mackintosh, MD  Cardiologist:  Lesleigh Noe, MD   Referring MD: Margaree Mackintosh, MD   No chief complaint on file.   History of Present Illness:    Charles Higgins is a 40 y.o. male with a hx of hyperlipidemia, primary hypertension (resistant), and prediabetes.  ***  Past Medical History:  Diagnosis Date  . Controlled diabetes mellitus type II without complication (HCC) 08/25/2013  . Elevated serum creatinine 08/25/2013  . Hyperlipidemia 06/23/2011  . Hypertension 03/10/2011  . Metabolic syndrome 06/28/2015  . Umbilical hernia 06/23/2011    Past Surgical History:  Procedure Laterality Date  . NO PAST SURGERIES      Current Medications: No outpatient medications have been marked as taking for the 05/29/20 encounter (Appointment) with Lyn Records, MD.     Allergies:   Patient has no known allergies.   Social History   Socioeconomic History  . Marital status: Married    Spouse name: Not on file  . Number of children: Not on file  . Years of education: Not on file  . Highest education level: Not on file  Occupational History  . Occupation: Insurance    Comment: [employer]  Tobacco Use  . Smoking status: Never Smoker  . Smokeless tobacco: Never Used  Vaping Use  . Vaping Use: Never used  Substance and Sexual Activity  . Alcohol use: No  . Drug use: No  . Sexual activity: Yes  Other Topics Concern  . Not on file  Social History Narrative   Has one 48 year old son and one on the way.   Social Determinants of Health   Financial Resource Strain: Not on file  Food Insecurity: Not on file  Transportation Needs: Not on file  Physical Activity: Not on file  Stress: Not on file  Social Connections: Not on file     Family History: The patient's family history includes CAD in his father; Diabetes in his father and maternal grandmother; Hypertension in his  father and mother; Kidney failure in his father.  ROS:   Please see the history of present illness.    *** All other systems reviewed and are negative.  EKGs/Labs/Other Studies Reviewed:    The following studies were reviewed today: ***  EKG:  EKG ***  Recent Labs: No results found for requested labs within last 8760 hours.  Recent Lipid Panel    Component Value Date/Time   CHOL 249 (H) 11/08/2018 1308   TRIG 60 11/08/2018 1308   HDL 43 11/08/2018 1308   CHOLHDL 5.8 (H) 11/08/2018 1308   VLDL 20 06/06/2016 1151   LDLCALC 190 (H) 11/08/2018 1308    Physical Exam:    VS:  There were no vitals taken for this visit.    Wt Readings from Last 3 Encounters:  05/19/20 246 lb (111.6 kg)  01/23/19 255 lb (115.7 kg)  11/09/18 248 lb (112.5 kg)     GEN: ***. No acute distress HEENT: Normal NECK: No JVD. LYMPHATICS: No lymphadenopathy CARDIAC: *** murmur. RRR *** gallop, or edema. VASCULAR: *** Normal Pulses. No bruits. RESPIRATORY:  Clear to auscultation without rales, wheezing or rhonchi  ABDOMEN: Soft, non-tender, non-distended, No pulsatile mass, MUSCULOSKELETAL: No deformity  SKIN: Warm and dry NEUROLOGIC:  Alert and oriented x 3 PSYCHIATRIC:  Normal affect   ASSESSMENT:    1.  Essential hypertension   2. Renal insufficiency   3. Controlled type 2 diabetes mellitus without complication, without long-term current use of insulin (HCC)   4. Hyperlipidemia, unspecified hyperlipidemia type   5. Educated about COVID-19 virus infection    PLAN:    In order of problems listed above:  1. ***   Medication Adjustments/Labs and Tests Ordered: Current medicines are reviewed at length with the patient today.  Concerns regarding medicines are outlined above.  No orders of the defined types were placed in this encounter.  No orders of the defined types were placed in this encounter.   There are no Patient Instructions on file for this visit.   Signed, Lesleigh Noe, MD  05/26/2020 5:01 PM    [facility]

## 2020-05-29 ENCOUNTER — Ambulatory Visit: Payer: No Typology Code available for payment source | Admitting: Interventional Cardiology

## 2020-05-29 DIAGNOSIS — I10 Essential (primary) hypertension: Secondary | ICD-10-CM

## 2020-05-29 DIAGNOSIS — N289 Disorder of kidney and ureter, unspecified: Secondary | ICD-10-CM

## 2020-05-29 DIAGNOSIS — E119 Type 2 diabetes mellitus without complications: Secondary | ICD-10-CM

## 2020-05-29 DIAGNOSIS — E785 Hyperlipidemia, unspecified: Secondary | ICD-10-CM

## 2020-05-29 DIAGNOSIS — Z7189 Other specified counseling: Secondary | ICD-10-CM

## 2020-05-31 ENCOUNTER — Other Ambulatory Visit: Payer: Self-pay | Admitting: Interventional Cardiology

## 2020-05-31 DIAGNOSIS — I10 Essential (primary) hypertension: Secondary | ICD-10-CM

## 2020-09-12 ENCOUNTER — Other Ambulatory Visit: Payer: Self-pay | Admitting: Internal Medicine

## 2020-09-12 DIAGNOSIS — E785 Hyperlipidemia, unspecified: Secondary | ICD-10-CM

## 2020-11-27 ENCOUNTER — Encounter: Payer: Self-pay | Admitting: Internal Medicine

## 2021-02-19 ENCOUNTER — Encounter: Payer: Self-pay | Admitting: Internal Medicine

## 2021-05-11 ENCOUNTER — Ambulatory Visit (INDEPENDENT_AMBULATORY_CARE_PROVIDER_SITE_OTHER): Payer: 59 | Admitting: Internal Medicine

## 2021-05-11 ENCOUNTER — Encounter: Payer: Self-pay | Admitting: Internal Medicine

## 2021-05-11 ENCOUNTER — Other Ambulatory Visit: Payer: Self-pay

## 2021-05-11 ENCOUNTER — Telehealth: Payer: Self-pay | Admitting: Internal Medicine

## 2021-05-11 VITALS — BP 180/142 | HR 85 | Temp 98.4°F

## 2021-05-11 DIAGNOSIS — E785 Hyperlipidemia, unspecified: Secondary | ICD-10-CM | POA: Diagnosis not present

## 2021-05-11 DIAGNOSIS — U071 COVID-19: Secondary | ICD-10-CM | POA: Diagnosis not present

## 2021-05-11 DIAGNOSIS — R7302 Impaired glucose tolerance (oral): Secondary | ICD-10-CM | POA: Diagnosis not present

## 2021-05-11 DIAGNOSIS — J22 Unspecified acute lower respiratory infection: Secondary | ICD-10-CM | POA: Diagnosis not present

## 2021-05-11 DIAGNOSIS — I10 Essential (primary) hypertension: Secondary | ICD-10-CM | POA: Diagnosis not present

## 2021-05-11 NOTE — Progress Notes (Signed)
° °  Subjective:    Patient ID: Charles Higgins, male    DOB: July 19, 1980, 41 y.o.   MRN: [id number]  HPI 41 year old Male see for protracted respiratory infection.  Patient says he had Covid-19 in December 2022 and has had persistent respiratory infection since that time.Youngest child, a son, currently has a cough.  He has a history of hypertension requiring multidrug treatment for control.  He currently is on amlodipine 10 mg daily ,Hyzaar 100/25 daily ,metoprolol XL 100 mg daily and Aldactone 25 mg - 1/2 tablet daily, Catapres 0.1 mg twice daily.  Patient had pneumonia of left lower lobe in January 2022.  He was diagnosed with COVID-19 at that time.  X-ray today shows no active cardiopulmonary disease.  Has not been seen here in person since July 2020.  Lipid panel in 2020 showed total cholesterol of 249 and an LDL cholesterol of 190.  He currently is not on statin therapy.  He is reluctant to take statin medication.  I have asked him to try Crestor 1 tablet by mouth 3 times a week but prefer that he be on daily Crestor.  He promises to follow-up with cardiology soon.  Records indicate he is only had 2 COVID vaccines in 2021.  Previously tried clonidine but it caused fatigue and rebound hypertension due to noncompliance.   He has a longstanding history of essential hypertension seen by Cardiology in 2018.  At that time renal artery ultrasound and echo recommended but patient declined.  Social history: Married with 2 children.  Works with [employer].  Does not smoke or use alcohol.  Family history: Father with history of heart and renal disease.  Father has diabetes.  Both parents have hypertension. Review of Systems he denies wheezing or shortness of breath     Objective:   Physical Exam Blood pressure 180/142 pulse 85 temperature 98.4 degrees pulse oximetry 99% Skin warm and dry.  Chest: Scattered inspiratory wheezing.  Chest x-ray shows no pneumonia.  Cardiac exam: Regular rate and rhythm  without ectopy.      Assessment & Plan:  Protracted lower respiratory infection.  History of COVID-20 May 2020.  Needs COVID booster in 2 months.  Difficult to control hypertension requiring multidrug regimen and should be followed by cardiology  Obesity  Type 2 diabetes mellitus-hemoglobin A1c in 2020 was 5.7%  History of COVID-19 in January 2022-needs COVID booster in 2 months  Chronic kidney disease stage IIIa which is concerning at his young age.  Needs to have tight control of diabetes and hypertension.  Plan: Doxycycline 100 mg twice daily for 10 days.  Methylprednisolone to take in tapering course starting with 6x 4 mg tablets and decreasing by 1 tablet daily i.e. 6-5-4-3-2-1 taper.  Needs to rest to be out of work for at least 2 to 3 days.  Have refill Norvasc losartan HCTZ 100/25, metoprolol XL 100 mg daily, Aldactone 25 mg 1/2 tablet daily, clonidine 0.1 mg twice daily.  He will also take Tessalon Perles 100 mg up to 3 times daily as needed for cough.  I am starting him on Crestor 5 mg 3 times a week but he likely needs to be on a higher dose.  He needs to follow-up with Cardiology regarding blood pressure management.  He has an appointment here in April for health maintenance exam.

## 2021-05-11 NOTE — Telephone Encounter (Signed)
Charles Higgins [phone]  Joevanni called to say he had COVID 04/17/2021, he still has a cough, wheezing and rattling in his chest.

## 2021-05-11 NOTE — Telephone Encounter (Signed)
After speaking with Dr Renold Genta, scheduled an appointment

## 2021-05-12 ENCOUNTER — Telehealth: Payer: Self-pay | Admitting: Internal Medicine

## 2021-05-12 MED ORDER — LOSARTAN POTASSIUM-HCTZ 100-25 MG PO TABS
1.0000 | ORAL_TABLET | Freq: Every day | ORAL | 0 refills | Status: DC
Start: 2021-05-12 — End: 2021-08-07

## 2021-05-12 MED ORDER — SPIRONOLACTONE 25 MG PO TABS: ORAL_TABLET | ORAL | 0 refills | Status: DC

## 2021-05-12 MED ORDER — BENZONATATE 100 MG PO CAPS: 100.0000 mg | ORAL_CAPSULE | Freq: Three times a day (TID) | ORAL | 0 refills | Status: DC | PRN

## 2021-05-12 MED ORDER — CLONIDINE HCL 0.1 MG PO TABS: 0.1000 mg | ORAL_TABLET | Freq: Two times a day (BID) | ORAL | 0 refills | Status: DC

## 2021-05-12 MED ORDER — METOPROLOL SUCCINATE ER 100 MG PO TB24: ORAL_TABLET | ORAL | 0 refills | Status: DC

## 2021-05-12 MED ORDER — AMLODIPINE BESYLATE 10 MG PO TABS: 10.0000 mg | ORAL_TABLET | Freq: Every day | ORAL | 0 refills | Status: DC

## 2021-05-12 MED ORDER — ROSUVASTATIN CALCIUM 5 MG PO TABS: ORAL_TABLET | ORAL | 1 refills | Status: DC

## 2021-05-12 NOTE — Telephone Encounter (Signed)
LVM that medications should be at the pharmacy

## 2021-05-12 NOTE — Telephone Encounter (Signed)
Charles Higgins called to check on his medications from his visit yesterday.   [id number] [place], [place] - [address] AT [facility] OF [address] Phone:  [phone]  Fax:  [phone]

## 2021-05-13 ENCOUNTER — Telehealth: Payer: Self-pay | Admitting: Internal Medicine

## 2021-05-13 DIAGNOSIS — J22 Unspecified acute lower respiratory infection: Secondary | ICD-10-CM

## 2021-05-13 MED ORDER — METHYLPREDNISOLONE 4 MG PO TBPK: ORAL_TABLET | ORAL | 0 refills | Status: DC

## 2021-05-13 MED ORDER — DOXYCYCLINE HYCLATE 100 MG PO TABS: 100.0000 mg | ORAL_TABLET | Freq: Two times a day (BID) | ORAL | 0 refills | Status: DC

## 2021-05-13 NOTE — Telephone Encounter (Signed)
Charles Higgins called back today and said he thought he was to get some prednisone and an antibiotic, he picked everything else up yesterday.

## 2021-05-13 NOTE — Telephone Encounter (Signed)
Sending in Doxycycline and medrol dosepack which were not sent with recent OV

## 2021-05-13 NOTE — Telephone Encounter (Signed)
Called patient to let him know medication had been sent in.

## 2021-05-31 ENCOUNTER — Encounter: Payer: Self-pay | Admitting: Internal Medicine

## 2021-05-31 NOTE — Patient Instructions (Signed)
I have refilled antihypertensive medications today.  He will take Tessalon Perles 100 mg up to 3 times daily as needed for cough.  Take Medrol in tapering course as directed.  Take doxycycline 100 mg twice daily for 10 days.  Needs to be out of work for at least 2 to 3 days.  Has health maintenance exam here scheduled for April 2023.  Needs to follow-up with cardiology regarding blood pressure management.  Keep appointment in April here for health maintenance exam.  Developing chronic kidney disease stage IIIa which is concerning.  Needs to have tight control of diabetes and hypertension.  He will likely need a higher dose of Crestor but he agreed to taking it 3 times weekly.

## 2021-06-03 ENCOUNTER — Other Ambulatory Visit: Payer: Self-pay | Admitting: Internal Medicine

## 2021-06-22 ENCOUNTER — Other Ambulatory Visit: Payer: Self-pay | Admitting: Internal Medicine

## 2021-08-07 ENCOUNTER — Other Ambulatory Visit: Payer: Self-pay | Admitting: Internal Medicine

## 2021-08-07 DIAGNOSIS — I10 Essential (primary) hypertension: Secondary | ICD-10-CM

## 2021-08-20 ENCOUNTER — Other Ambulatory Visit: Payer: 59

## 2021-08-23 ENCOUNTER — Encounter: Payer: 59 | Admitting: Internal Medicine

## 2021-09-14 ENCOUNTER — Ambulatory Visit (INDEPENDENT_AMBULATORY_CARE_PROVIDER_SITE_OTHER): Payer: 59 | Admitting: Internal Medicine

## 2021-09-14 ENCOUNTER — Encounter: Payer: Self-pay | Admitting: Internal Medicine

## 2021-09-14 VITALS — BP 118/88 | HR 50 | Temp 97.5°F | Ht 68.0 in | Wt 259.2 lb

## 2021-09-14 DIAGNOSIS — E785 Hyperlipidemia, unspecified: Secondary | ICD-10-CM | POA: Diagnosis not present

## 2021-09-14 DIAGNOSIS — Z125 Encounter for screening for malignant neoplasm of prostate: Secondary | ICD-10-CM

## 2021-09-14 DIAGNOSIS — E119 Type 2 diabetes mellitus without complications: Secondary | ICD-10-CM

## 2021-09-14 DIAGNOSIS — N1831 Chronic kidney disease, stage 3a: Secondary | ICD-10-CM | POA: Diagnosis not present

## 2021-09-14 DIAGNOSIS — I10 Essential (primary) hypertension: Secondary | ICD-10-CM

## 2021-09-14 NOTE — Progress Notes (Signed)
   Subjective:    Patient ID: Charles Higgins, male    DOB: Feb 05, 1981, 41 y.o.   MRN: [id number]  HPI 41 year old Male seen for Follow up on HTN, hyperlipidemia, obesity and DM.  Had labs drawn recently and CBC is within normal limits.  PSA is normal, hemoglobin A1c excellent at 5.8%, fasting glucose excellent at 104.  However, he appears to be developing chronic kidney disease his creatinine is 1.48 and 2 years ago was 1.29.  This is concerning.  We talked about referral to Nephrology.  He does not want to do this at this point in time.  He needs to consider ultrasound of his kidneys.  He agrees to follow-up in 6 months here.  Currently on amlodipine 10 mg daily, clonidine 0.1 mg twice daily losartan/HCTZ 100/25 daily and metoprolol 100 mg daily.  He is on Crestor 5 mg 3 times a week.  Dr. Katrinka Blazing had prescribed Aldactone 25 mg 1/2 tablet daily in January 2023.  Social history: He is married.  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  Does not smoke or consume alcohol.  Had left lower lobe pneumonia in January 2022 associated with COVID-19.  Records indicate only 2 COVID vaccines in 2021.  Need to consider pneumococcal vaccine since he has a history of diabetes.  Review of Systems Reminded about diabetic eye exam     Objective:   Physical Exam Blood pressure 118/88 pulse 50 regular temperature 97.5 degrees by ear thermometer BMI 39.42 weight 259 pounds 4 ounces pulse oximetry 99% Skin: Warm and dry.  No cervical adenopathy, thyromegaly or carotid bruits.  Chest is clear to auscultation without rales or wheezing.  Cardiac exam: Regular rate and rhythm without ectopy or murmurs.  Trace lower extremity pitting edema.     Assessment & Plan:  Type 2 diabetes mellitus with good control-reminded about diabetic eye exam.  Currently this is diet-controlled.  He is a candidate for weight loss medication such as Wegovy.  He agrees to return in 6 months for reevaluation.  History of low  HDL but currently HDL is low normal at 40  Pure hypercholesterolemia.  Total cholesterol is 205 with an LDL cholesterol of 147.  This represents improvement from 2 years ago when total cholesterol was 249 and LDL was 190.  At visit in November we had him start Crestor 5 mg 3 times a week.  It may not be helping all that much and I am not sure he has been taking it recently.  He is a bit reluctant to be on chronic statin medication but that would be encouraged with his history of glucose intolerance.  We could consider coronary calcium scoring.  We can revisit this in 6 months.  Hypertension currently stable with current regimen of multiple medications  Screening for prostate cancer-PSA is normal  Elevated serum creatinine-consider Nephrology evaluation.  Patient declines at this time will reevaluate in 6 months  Plan: Continue diet and exercise efforts.  Continue current medications and follow-up in 6 months.

## 2021-09-15 LAB — CBC WITH DIFFERENTIAL/PLATELET
Absolute Monocytes: 446 cells/uL (ref 200–950)
Basophils Absolute: 39 cells/uL (ref 0–200)
Basophils Relative: 0.8 %
Eosinophils Absolute: 108 cells/uL (ref 15–500)
Eosinophils Relative: 2.2 %
HCT: 47.6 % (ref 38.5–50.0)
Hemoglobin: 15.1 g/dL (ref 13.2–17.1)
Lymphs Abs: 1877 cells/uL (ref 850–3900)
MCH: 26.3 pg — ABNORMAL LOW (ref 27.0–33.0)
MCHC: 31.7 g/dL — ABNORMAL LOW (ref 32.0–36.0)
MCV: 82.9 fL (ref 80.0–100.0)
MPV: 10.2 fL (ref 7.5–12.5)
Monocytes Relative: 9.1 %
Neutro Abs: 2430 cells/uL (ref 1500–7800)
Neutrophils Relative %: 49.6 %
Platelets: 217 10*3/uL (ref 140–400)
RBC: 5.74 10*6/uL (ref 4.20–5.80)
RDW: 12.9 % (ref 11.0–15.0)
Total Lymphocyte: 38.3 %
WBC: 4.9 10*3/uL (ref 3.8–10.8)

## 2021-09-15 LAB — COMPLETE METABOLIC PANEL WITH GFR
AG Ratio: 1.2 (calc) (ref 1.0–2.5)
ALT: 23 U/L (ref 9–46)
AST: 23 U/L (ref 10–40)
Albumin: 4.4 g/dL (ref 3.6–5.1)
Alkaline phosphatase (APISO): 70 U/L (ref 36–130)
BUN/Creatinine Ratio: 11 (calc) (ref 6–22)
BUN: 17 mg/dL (ref 7–25)
CO2: 32 mmol/L (ref 20–32)
Calcium: 9.4 mg/dL (ref 8.6–10.3)
Chloride: 102 mmol/L (ref 98–110)
Creat: 1.48 mg/dL — ABNORMAL HIGH (ref 0.60–1.29)
Globulin: 3.6 g/dL (calc) (ref 1.9–3.7)
Glucose, Bld: 104 mg/dL — ABNORMAL HIGH (ref 65–99)
Potassium: 4.5 mmol/L (ref 3.5–5.3)
Sodium: 140 mmol/L (ref 135–146)
Total Bilirubin: 0.9 mg/dL (ref 0.2–1.2)
Total Protein: 8 g/dL (ref 6.1–8.1)
eGFR: 61 mL/min/{1.73_m2} (ref >=60)

## 2021-09-15 LAB — LIPID PANEL
Cholesterol: 205 mg/dL — ABNORMAL HIGH (ref <200)
HDL: 40 mg/dL (ref >=40)
LDL Cholesterol (Calc): 147 mg/dL (calc) — ABNORMAL HIGH
Non-HDL Cholesterol (Calc): 165 mg/dL (calc) — ABNORMAL HIGH (ref <130)
Total CHOL/HDL Ratio: 5.1 (calc) — ABNORMAL HIGH (ref <5.0)
Triglycerides: 78 mg/dL (ref <150)

## 2021-09-15 LAB — MICROALBUMIN, URINE: Microalb, Ur: 1.3 mg/dL

## 2021-09-15 LAB — HEMOGLOBIN A1C
Hgb A1c MFr Bld: 5.8 % of total Hgb — ABNORMAL HIGH (ref <5.7)
Mean Plasma Glucose: 120 mg/dL
eAG (mmol/L): 6.6 mmol/L

## 2021-09-15 LAB — PSA: PSA: 0.65 ng/mL (ref <=4.00)

## 2021-11-17 ENCOUNTER — Other Ambulatory Visit: Payer: Self-pay | Admitting: Internal Medicine

## 2021-11-17 DIAGNOSIS — E785 Hyperlipidemia, unspecified: Secondary | ICD-10-CM

## 2021-11-22 ENCOUNTER — Other Ambulatory Visit: Payer: Self-pay | Admitting: Internal Medicine

## 2022-01-14 IMAGING — DX DG CHEST 2V
2 series · 2 of 2 positions shown · non-contrast
Comparison: None

CLINICAL DATA: Cough, rales in RIGHT lower lobe, fever,
hypertension, type II diabetes mellitus

EXAM:
CHEST - 2 VIEW

[chest pa]
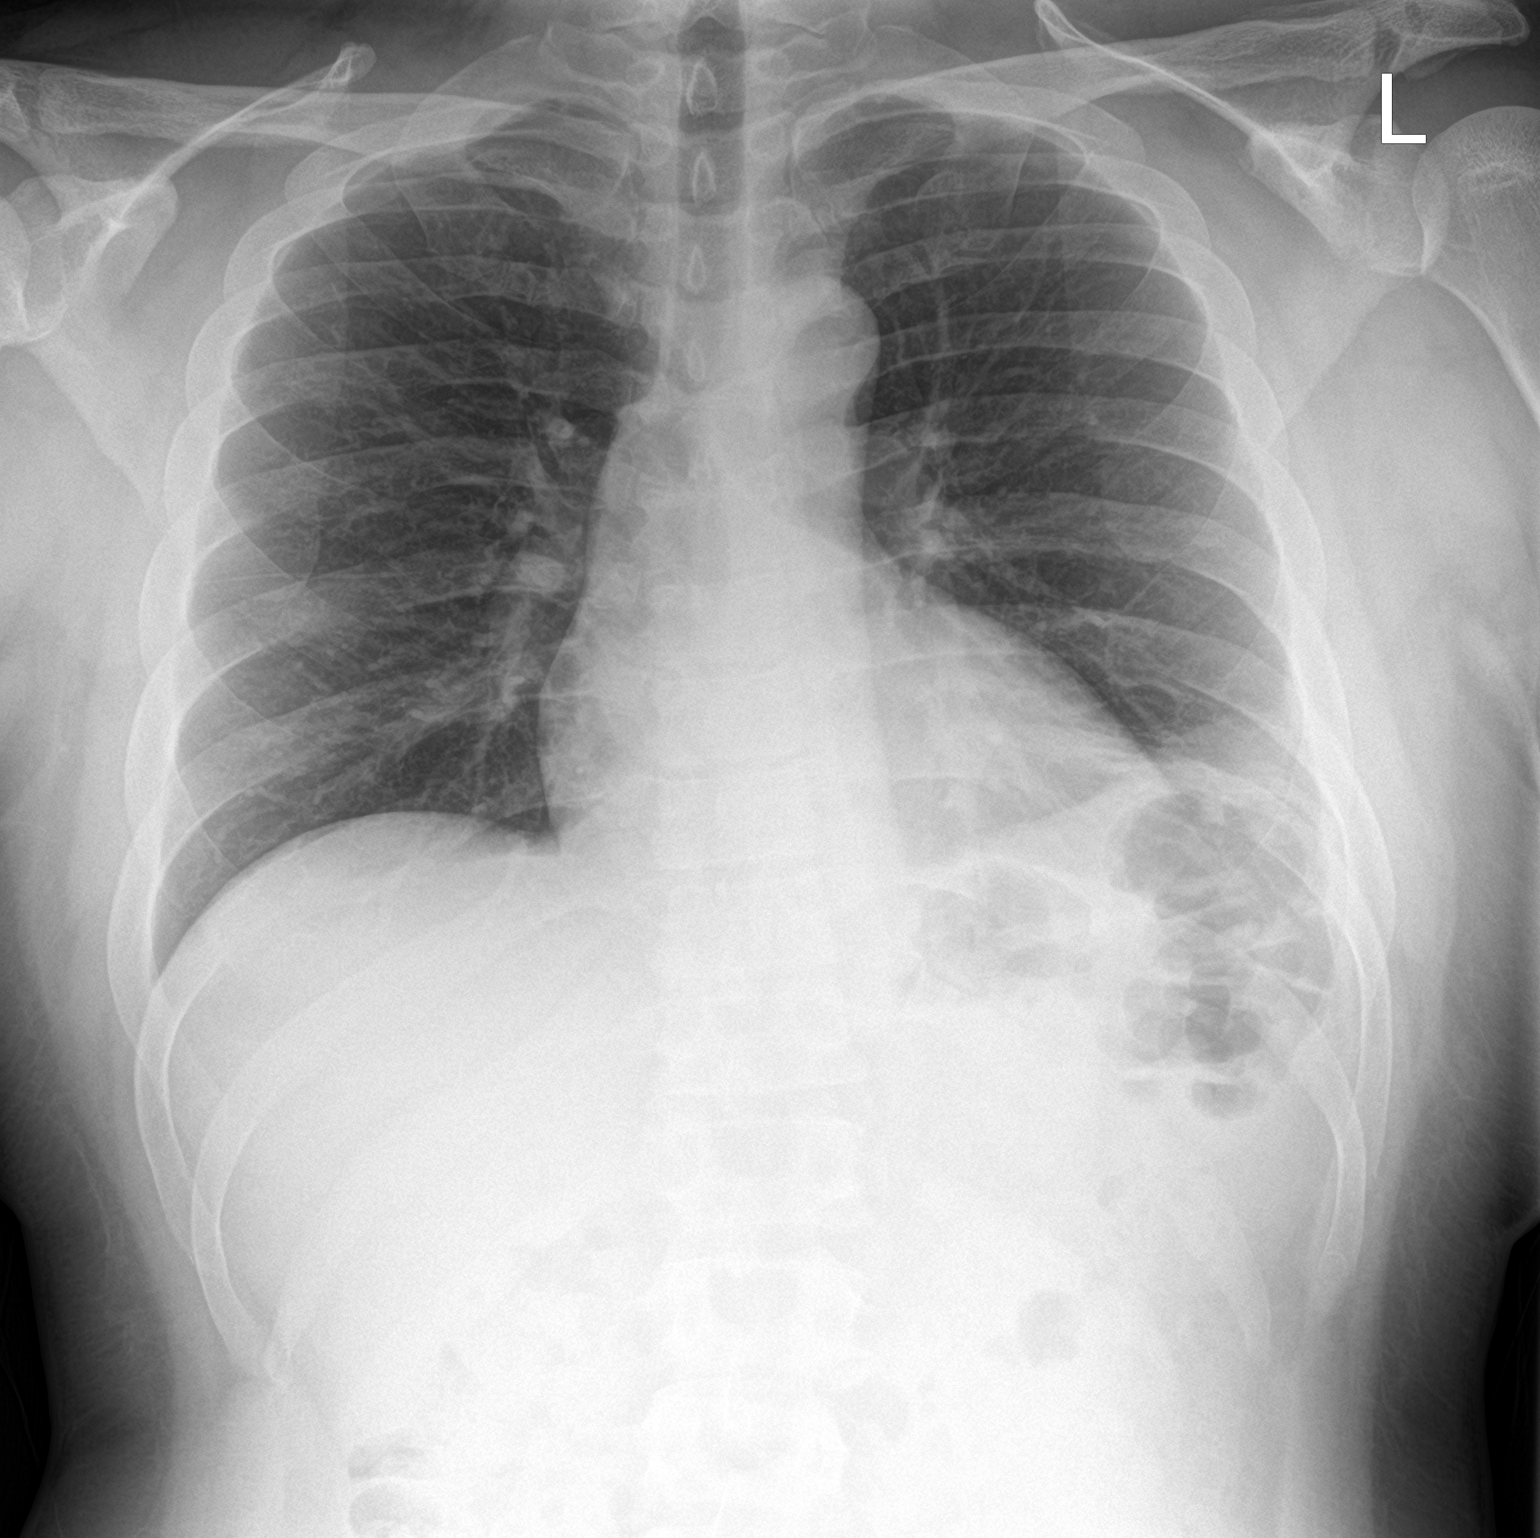

[chest lat]
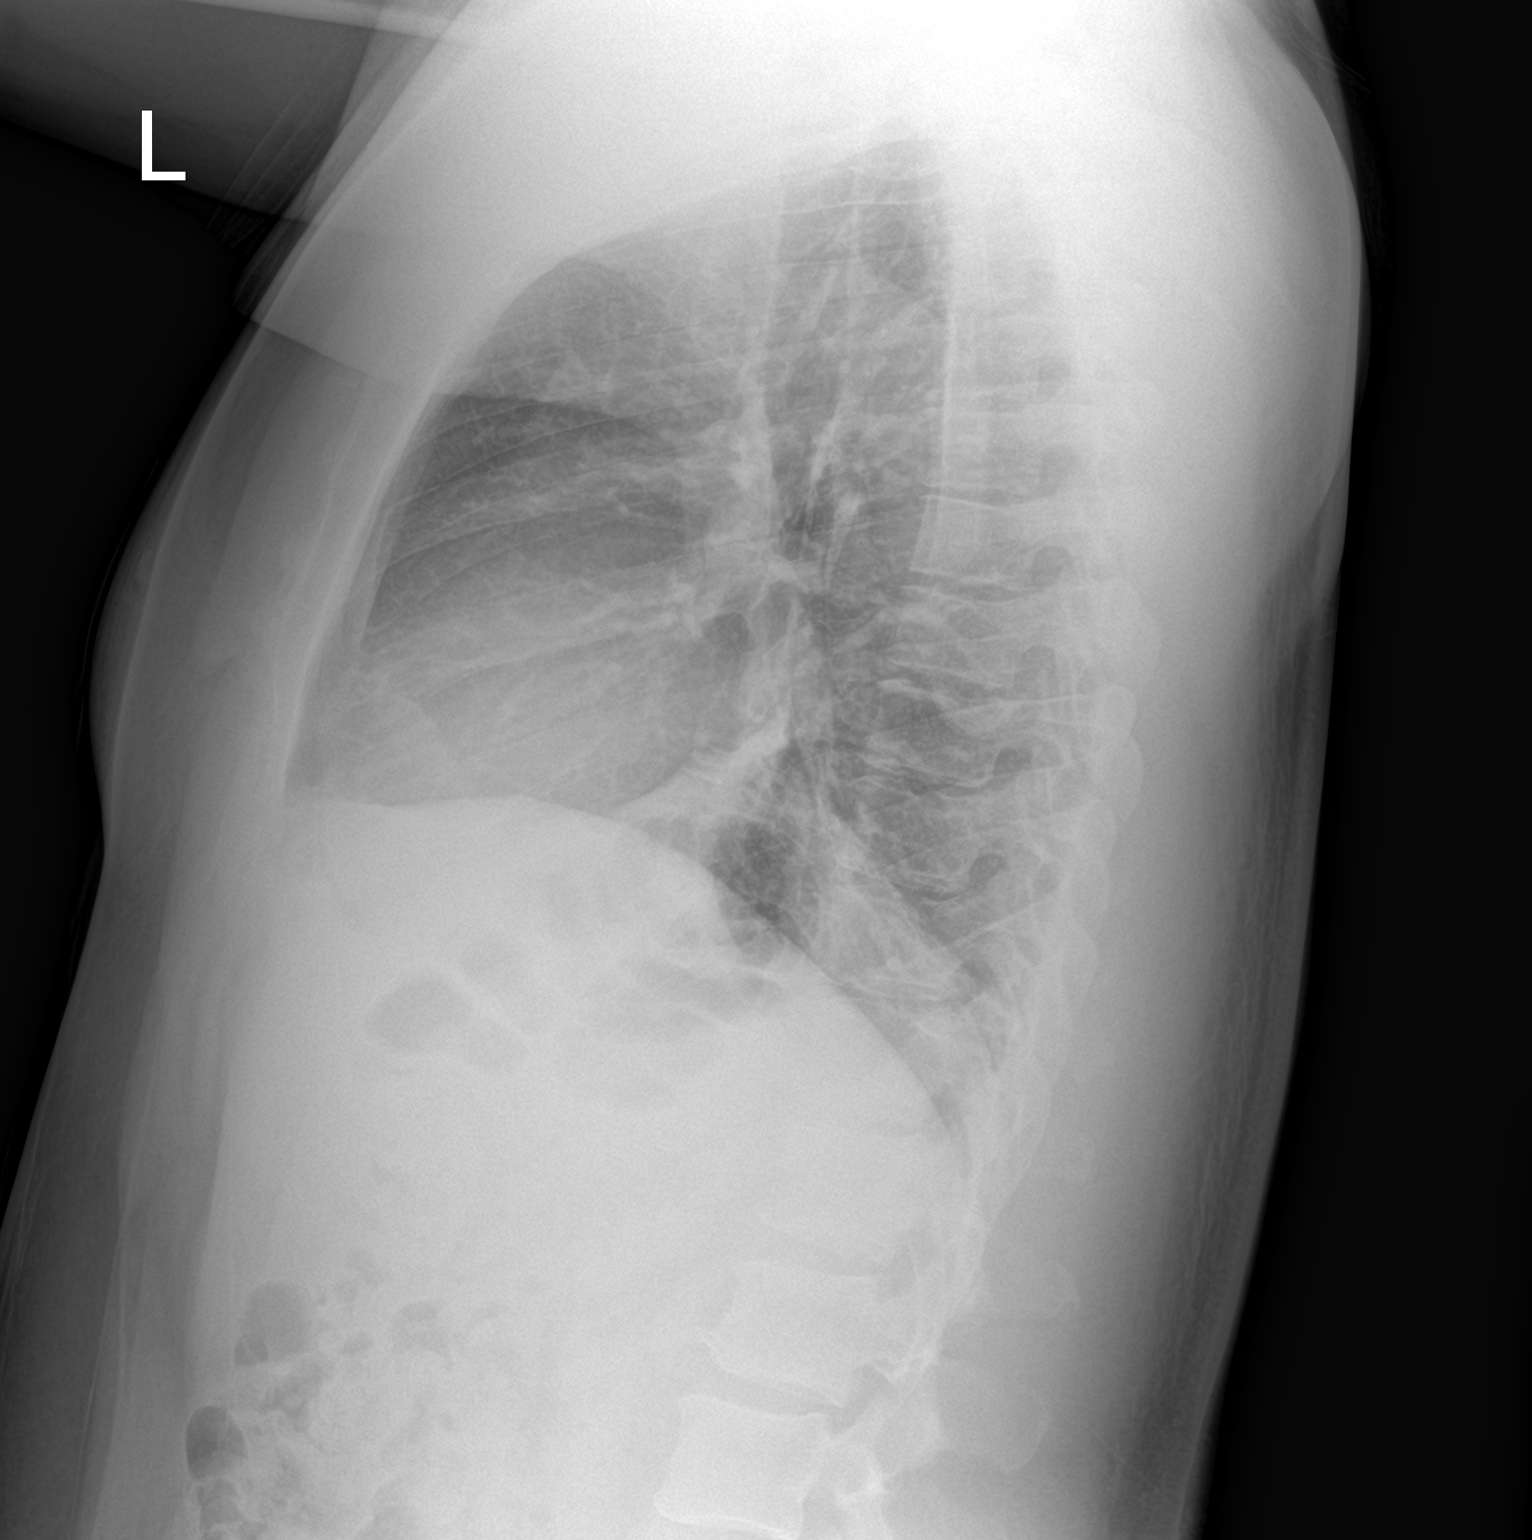

[2 of 2 positions shown; findings below may reference images not displayed]

FINDINGS: Normal heart size, mediastinal contours, and pulmonary vascularity.

LEFT basilar infiltrate consistent with LEFT lower lobe pneumonia.

Remaining lungs clear.

No pleural effusion or pneumothorax.

Minimal levoconvex thoracolumbar scoliosis.
IMPRESSION: LEFT lower lobe consolidation consistent with pneumonia.

## 2022-01-25 IMAGING — CR DG CHEST 2V
2 series · 2 of 2 positions shown · non-contrast
Comparison: 05/18/2020

CLINICAL DATA: Follow-up pneumonia

EXAM:
CHEST - 2 VIEW

[w chest pa]
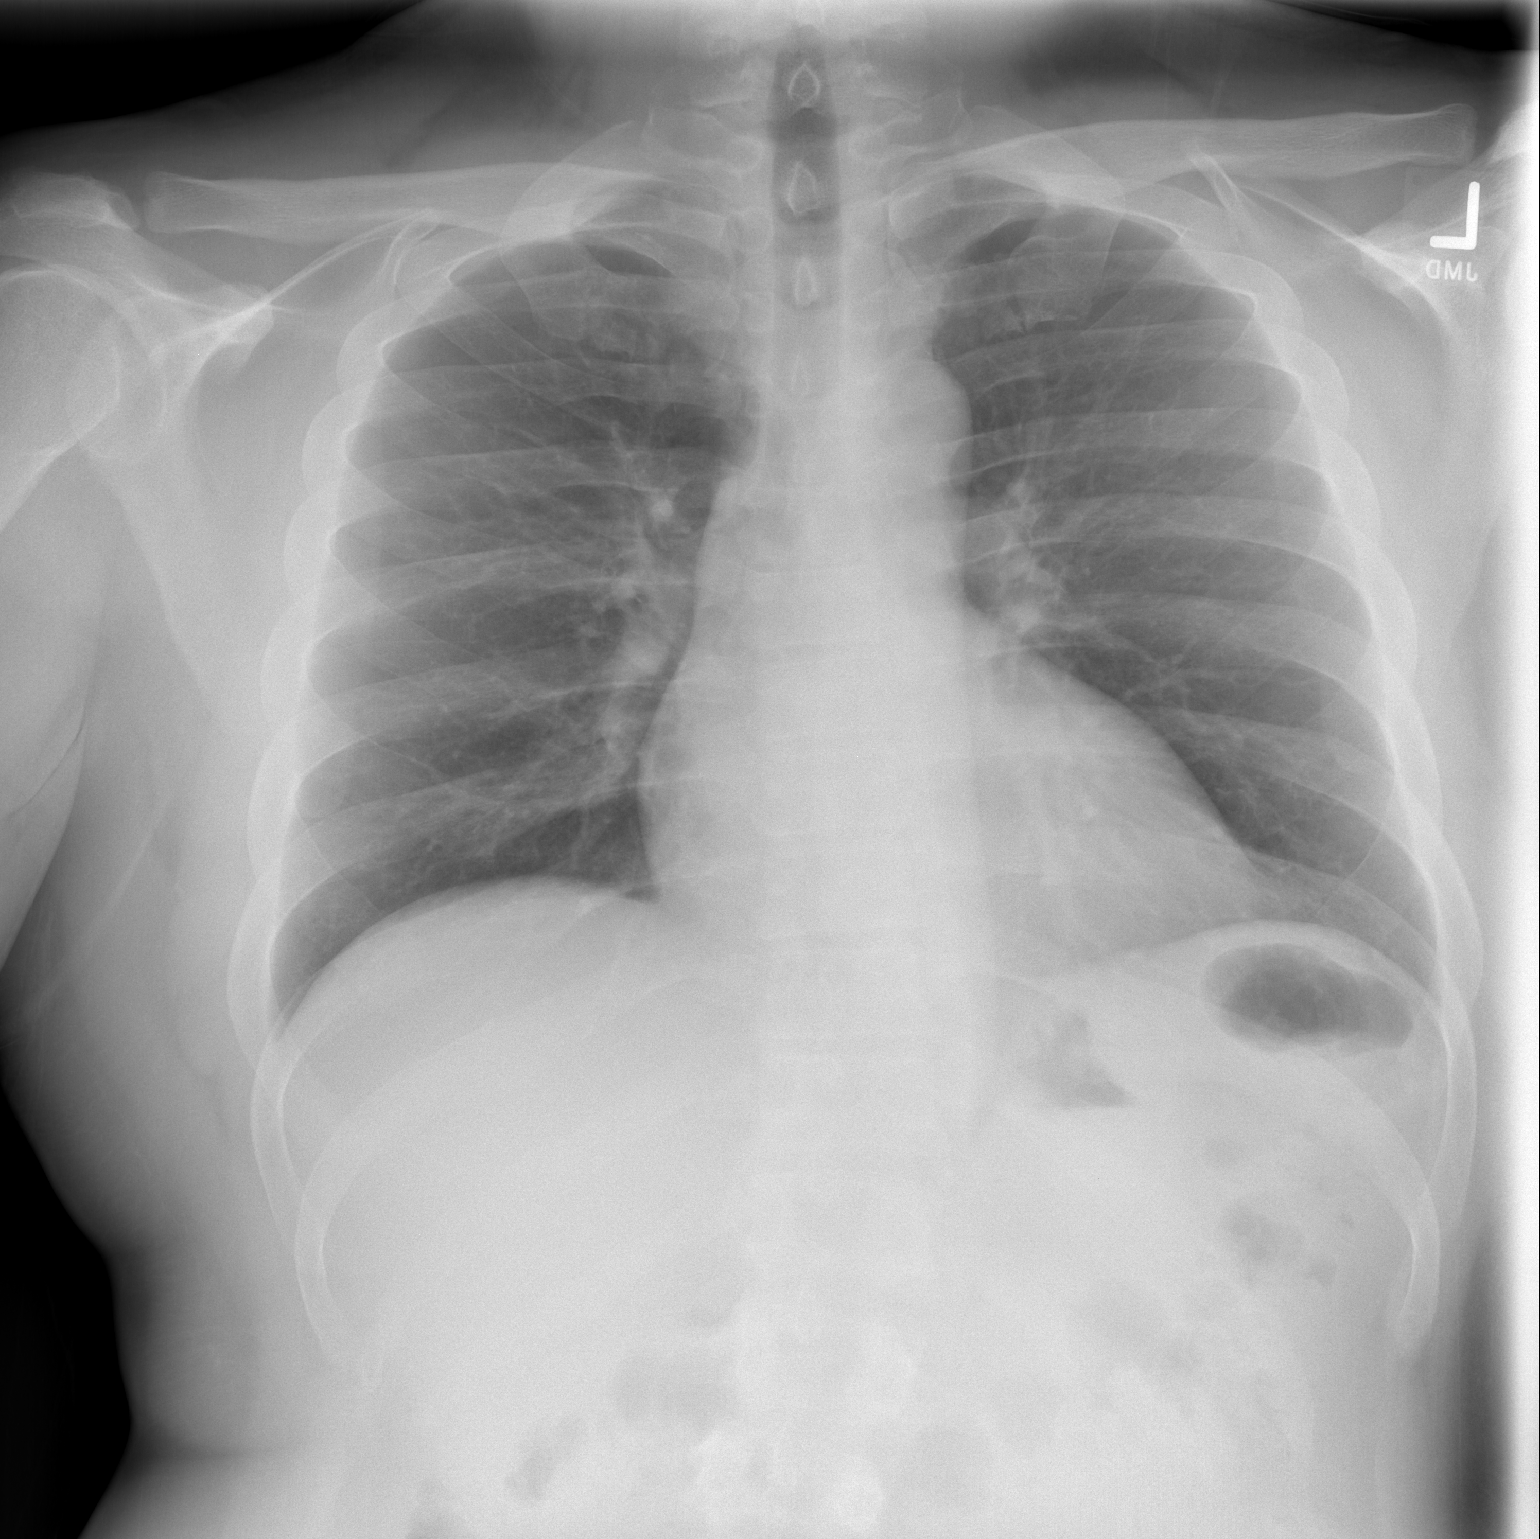

[w chest lat]
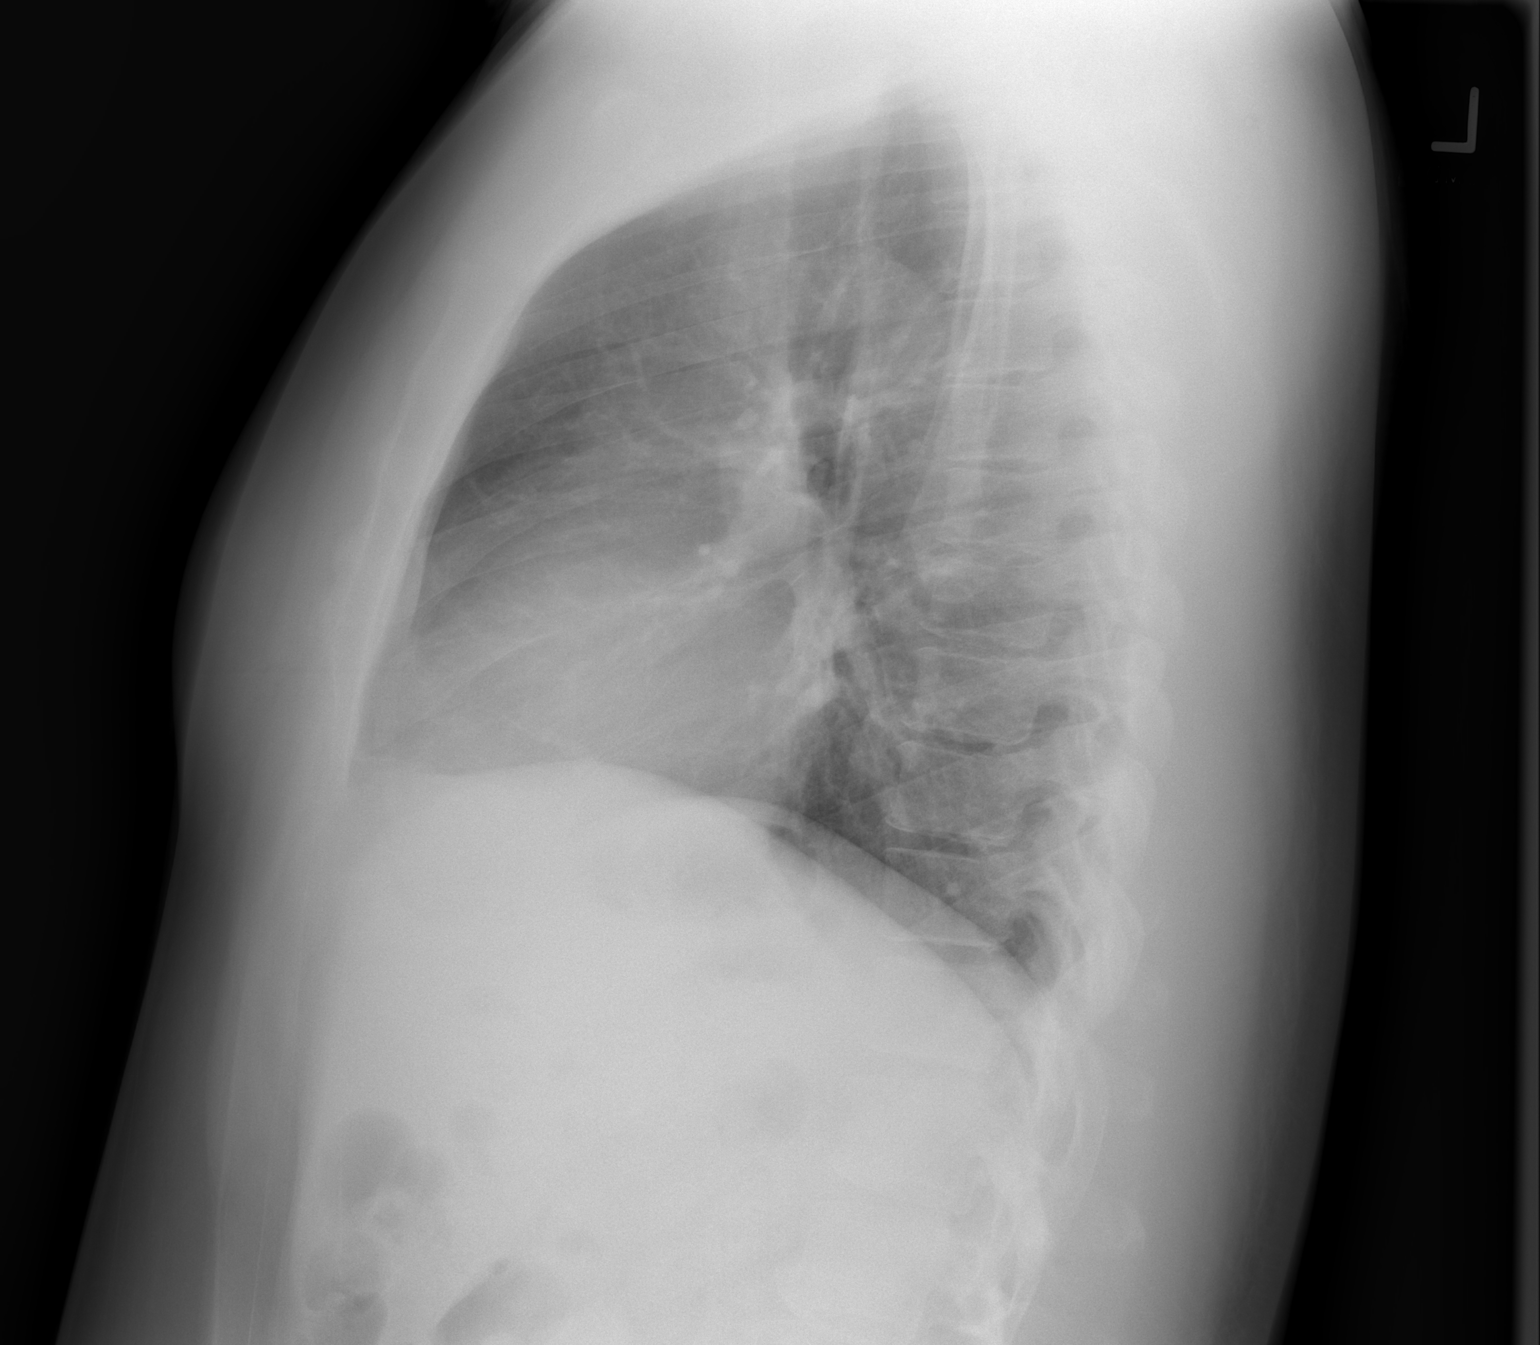

[2 of 2 positions shown; findings below may reference images not displayed]

FINDINGS: The heart size and mediastinal contours are within normal limits.
Previously seen left lower lobe airspace consolidation has resolved.
Lungs are clear. No pleural effusion or pneumothorax. The visualized
skeletal structures are unremarkable.
IMPRESSION: No active cardiopulmonary disease. Previously seen left lower lobe
airspace consolidation has resolved.

## 2022-03-15 ENCOUNTER — Encounter: Payer: Self-pay | Admitting: Internal Medicine

## 2022-03-22 ENCOUNTER — Encounter: Payer: Self-pay | Admitting: Internal Medicine

## 2022-03-22 ENCOUNTER — Ambulatory Visit: Payer: BC Managed Care – PPO | Admitting: Internal Medicine

## 2022-03-22 ENCOUNTER — Other Ambulatory Visit: Payer: BC Managed Care – PPO

## 2022-03-22 VITALS — BP 136/86 | HR 61 | Temp 98.3°F | Ht 68.0 in | Wt 255.8 lb

## 2022-03-22 DIAGNOSIS — E119 Type 2 diabetes mellitus without complications: Secondary | ICD-10-CM

## 2022-03-22 DIAGNOSIS — N1831 Chronic kidney disease, stage 3a: Secondary | ICD-10-CM

## 2022-03-22 DIAGNOSIS — E8881 Metabolic syndrome: Secondary | ICD-10-CM

## 2022-03-22 DIAGNOSIS — Z6838 Body mass index (BMI) 38.0-38.9, adult: Secondary | ICD-10-CM | POA: Diagnosis not present

## 2022-03-22 DIAGNOSIS — I10 Essential (primary) hypertension: Secondary | ICD-10-CM

## 2022-03-22 DIAGNOSIS — R7302 Impaired glucose tolerance (oral): Secondary | ICD-10-CM

## 2022-03-22 DIAGNOSIS — R7989 Other specified abnormal findings of blood chemistry: Secondary | ICD-10-CM | POA: Diagnosis not present

## 2022-03-22 DIAGNOSIS — N1832 Chronic kidney disease, stage 3b: Secondary | ICD-10-CM | POA: Diagnosis not present

## 2022-03-22 MED ORDER — ROSUVASTATIN CALCIUM 5 MG PO TABS: 5.0000 mg | ORAL_TABLET | Freq: Every day | ORAL | 3 refills | Status: DC

## 2022-03-22 MED ORDER — SPIRONOLACTONE 25 MG PO TABS: 25.0000 mg | ORAL_TABLET | Freq: Every day | ORAL | 3 refills | Status: DC

## 2022-03-22 NOTE — Progress Notes (Signed)
   Subjective:    Patient ID: Charles Higgins, male    DOB: [birth year], 41 y.o.   MRN: [id number]  HPI He is here for follow up on hypertension, hyperlipidemia, obesity and diabetes mellitus.  I am concerned because his creatinine has been elevated since 2018.  It has improved from May when it was 1.48 and is now 1.33.  Seems to have chronic kidney disease stage IIIb. Estimated GFR is 69 cc/min.  Has not wanted to be seen at Nephrology.  Hemoglobin A1c is 6.1% and was 5.8% in May.  I would like to have him on glucose lowering medication but he prefers not to at this time.  For hypertension he is on spironolactone, metoprolol, losartan/HCTZ, clonidine and amlodipine.  In May his LDL was 147.  It was not checked with this visit.  Had been 193 years ago.  I am starting him on low-dose Crestor 5 mg daily which he reluctantly agrees with.     Review of Systems see above-no new complaints     Objective:   Physical Exam  Vital signs reviewed.  Blood pressure 144/92 large cuff and  recheck reading was 136/86.  His BMI is 38.89.  Skin: Warm and dry.  No cervical adenopathy, thyromegaly or carotid bruits.  Chest clear.  Cardiac exam: Regular rate and rhythm without ectopy or murmur.  No lower extremity pitting edema.      Assessment & Plan:  Essential hypertension-requiring multidrug regimen  BMI 38.89  Impaired glucose tolerance  Elevated serum creatinine consistent with chronic kidney disease stage IIIb  Plan: He is going to return in May for health maintenance exam and continue current regimen.  He will continue to work with diet exercise and weight loss efforts.  He prefers not to see nephrology for advice at this point in time.

## 2022-03-22 NOTE — Patient Instructions (Addendum)
Labs drawn and pending. Further instructions to follow. It may be a good idea for him to see [facility] with history of chronic kidney disease.  However, he prefers not to at this point in time.  His BP is fairly well controlled today on current regimen.  Starting low-dose Crestor 5 mg daily.  His creatinine is improved from May and is now 1.33.  He is returning in March for physical exam and evaluation with fasting labs.  We will see how kidney functions are at that time.  Encourage diet exercise and weight loss.

## 2022-03-23 LAB — COMPLETE METABOLIC PANEL WITH GFR
AG Ratio: 1.3 (calc) (ref 1.0–2.5)
ALT: 16 U/L (ref 9–46)
AST: 17 U/L (ref 10–40)
Albumin: 4.3 g/dL (ref 3.6–5.1)
Alkaline phosphatase (APISO): 73 U/L (ref 36–130)
BUN/Creatinine Ratio: 13 (calc) (ref 6–22)
BUN: 17 mg/dL (ref 7–25)
CO2: 31 mmol/L (ref 20–32)
Calcium: 9.3 mg/dL (ref 8.6–10.3)
Chloride: 102 mmol/L (ref 98–110)
Creat: 1.33 mg/dL — ABNORMAL HIGH (ref 0.60–1.29)
Globulin: 3.4 g/dL (calc) (ref 1.9–3.7)
Glucose, Bld: 100 mg/dL — ABNORMAL HIGH (ref 65–99)
Potassium: 4.6 mmol/L (ref 3.5–5.3)
Sodium: 141 mmol/L (ref 135–146)
Total Bilirubin: 0.5 mg/dL (ref 0.2–1.2)
Total Protein: 7.7 g/dL (ref 6.1–8.1)
eGFR: 69 mL/min/{1.73_m2} (ref >=60)

## 2022-03-23 LAB — CBC WITH DIFFERENTIAL/PLATELET
Absolute Monocytes: 495 cells/uL (ref 200–950)
Basophils Absolute: 28 cells/uL (ref 0–200)
Basophils Relative: 0.5 %
Eosinophils Absolute: 176 cells/uL (ref 15–500)
Eosinophils Relative: 3.2 %
HCT: 46.3 % (ref 38.5–50.0)
Hemoglobin: 14.9 g/dL (ref 13.2–17.1)
Lymphs Abs: 2233 cells/uL (ref 850–3900)
MCH: 26.4 pg — ABNORMAL LOW (ref 27.0–33.0)
MCHC: 32.2 g/dL (ref 32.0–36.0)
MCV: 81.9 fL (ref 80.0–100.0)
MPV: 10.8 fL (ref 7.5–12.5)
Monocytes Relative: 9 %
Neutro Abs: 2569 cells/uL (ref 1500–7800)
Neutrophils Relative %: 46.7 %
Platelets: 233 10*3/uL (ref 140–400)
RBC: 5.65 10*6/uL (ref 4.20–5.80)
RDW: 12.8 % (ref 11.0–15.0)
Total Lymphocyte: 40.6 %
WBC: 5.5 10*3/uL (ref 3.8–10.8)

## 2022-03-23 LAB — HEMOGLOBIN A1C
Hgb A1c MFr Bld: 6.1 % of total Hgb — ABNORMAL HIGH (ref <5.7)
Mean Plasma Glucose: 128 mg/dL
eAG (mmol/L): 7.1 mmol/L

## 2022-03-23 LAB — MICROALBUMIN / CREATININE URINE RATIO
Creatinine, Urine: 176 mg/dL (ref 20–320)
Microalb Creat Ratio: 7 mcg/mg creat (ref <30)
Microalb, Ur: 1.3 mg/dL

## 2022-06-16 ENCOUNTER — Ambulatory Visit: Payer: Self-pay | Admitting: Internal Medicine

## 2022-07-15 NOTE — Progress Notes (Shared)
Patient Care Team: Elby Showers, MD as PCP - General (Internal Medicine) Belva Crome, MD (Inactive) as PCP - Cardiology (Cardiology)  Visit Date: 07/15/22  Subjective:    Patient ID: Charles Higgins , Male   DOB: 02-Sep-1980, 42 y.o.    MRN: [id number]   42 y.o. Male presents today for annual comprehensive physical exam. Patient has a past medical history of Type 2 diabetes mellitus, hyperlipidemia, hypertension, umbilical hernia, elevated serum creatinine.  History of hypertension treated with Norvasc 10 mg daily, Catapres 0.1 mg twice daily, losartan-hydrochlorothiazide 100-25 mg daily, Toprol-XL 100 mg daily, Aldactone 25 mg daily.  History of hyperlipidemia treated with Crestor 5 mg daily.  PSA normal at 0.65 on 09/14/21.    Vaccine Counseling: Due for tetanus vaccine. UTD on flu, Covid-19 vaccines.  Past Medical History:  Diagnosis Date   Controlled diabetes mellitus type II without complication ([place]) [id number]   Elevated serum creatinine 08/25/2013   Hyperlipidemia 06/23/2011   Hypertension 99991111   Metabolic syndrome [id number]   Umbilical hernia [id number]     Family History  Problem Relation Age of Onset   Diabetes Father    Hypertension Father    Kidney failure Father    CAD Father    Hypertension Mother    Diabetes Maternal Grandmother     Social History   Social History Narrative   Has one 67 year old son and one on the way.      ROS      Objective:   Vitals: There were no vitals taken for this visit.   Physical Exam    Results:   Studies obtained and personally reviewed by me:  Imaging, colonoscopy, mammogram, bone density scan, echocardiogram, heart cath, stress test, CT calcium score, etc. ***   Labs:       Component Value Date/Time   NA 141 03/22/2022 0917   NA 140 01/31/2019 0728   K 4.6 03/22/2022 0917   CL 102 03/22/2022 0917   CO2 31 03/22/2022 0917   GLUCOSE 100 (H) 03/22/2022 0917   BUN 17 03/22/2022 0917   BUN  16 01/31/2019 0728   CREATININE 1.33 (H) 03/22/2022 0917   CALCIUM 9.3 03/22/2022 0917   PROT 7.7 03/22/2022 0917   ALBUMIN 4.3 06/06/2016 1151   AST 17 03/22/2022 0917   ALT 16 03/22/2022 0917   ALKPHOS 61 06/06/2016 1151   BILITOT 0.5 03/22/2022 0917   GFRNONAA 70 01/31/2019 0728   GFRNONAA 62 11/08/2018 1308   GFRAA 81 01/31/2019 0728   GFRAA 72 11/08/2018 1308     Lab Results  Component Value Date   WBC 5.5 03/22/2022   HGB 14.9 03/22/2022   HCT 46.3 03/22/2022   MCV 81.9 03/22/2022   PLT 233 03/22/2022    Lab Results  Component Value Date   CHOL 205 (H) 09/14/2021   HDL 40 09/14/2021   LDLCALC 147 (H) 09/14/2021   TRIG 78 09/14/2021   CHOLHDL 5.1 (H) 09/14/2021    Lab Results  Component Value Date   HGBA1C 6.1 (H) 03/22/2022     No results found for: "TSH"   Lab Results  Component Value Date   PSA 0.65 09/14/2021   *** delete for male pts  ***    Assessment & Plan:   ***    I,Alexander Ruley,acting as a scribe for Elby Showers, MD.,have documented all relevant documentation on the behalf of Elby Showers, MD,as directed by  Elby Showers, MD  while in the presence of Elby Showers, MD.   Marland Kitchen

## 2022-07-22 ENCOUNTER — Other Ambulatory Visit: Payer: BC Managed Care – PPO

## 2022-07-22 ENCOUNTER — Encounter: Payer: BC Managed Care – PPO | Admitting: Internal Medicine

## 2022-07-22 DIAGNOSIS — I10 Essential (primary) hypertension: Secondary | ICD-10-CM

## 2022-07-22 DIAGNOSIS — R5383 Other fatigue: Secondary | ICD-10-CM

## 2022-07-22 DIAGNOSIS — E785 Hyperlipidemia, unspecified: Secondary | ICD-10-CM

## 2022-07-22 DIAGNOSIS — E119 Type 2 diabetes mellitus without complications: Secondary | ICD-10-CM

## 2023-01-26 ENCOUNTER — Other Ambulatory Visit: Payer: Self-pay | Admitting: Internal Medicine

## 2023-01-26 ENCOUNTER — Telehealth: Payer: Self-pay | Admitting: Internal Medicine

## 2023-01-26 DIAGNOSIS — I10 Essential (primary) hypertension: Secondary | ICD-10-CM

## 2023-01-26 NOTE — Telephone Encounter (Signed)
Charles Higgins [phone]  Charles Higgins is calling to be seen with hip, back and buttock pain, he says its been going on for couple of weeks and is getting worse. He can't sit for long periods of time and he is now walking with a limp, it is very painful. When he NO showed his CPE in March we removed you as his PCP.

## 2023-01-26 NOTE — Telephone Encounter (Signed)
Called Zalan and advised to go to Ortho and also let him know he was no longer a patient here and he let me know I was wrong he had come to his appointment this year and he would look it up and call me back. I said okay.

## 2023-01-27 DIAGNOSIS — M5442 Lumbago with sciatica, left side: Secondary | ICD-10-CM | POA: Diagnosis not present

## 2023-01-27 DIAGNOSIS — M25552 Pain in left hip: Secondary | ICD-10-CM | POA: Diagnosis not present

## 2023-01-27 DIAGNOSIS — M545 Low back pain, unspecified: Secondary | ICD-10-CM | POA: Diagnosis not present

## 2023-09-18 ENCOUNTER — Other Ambulatory Visit: Payer: Self-pay

## 2023-12-08 ENCOUNTER — Emergency Department (HOSPITAL_COMMUNITY)

## 2023-12-08 ENCOUNTER — Other Ambulatory Visit: Payer: Self-pay

## 2023-12-08 ENCOUNTER — Inpatient Hospital Stay (HOSPITAL_COMMUNITY)
Admission: EM | Admit: 2023-12-08 | Discharge: 2023-12-13 | DRG: 304 | Disposition: A | Discharge status: Home / Self Care | Attending: Neurology | Admitting: Neurology

## 2023-12-08 ENCOUNTER — Encounter (HOSPITAL_COMMUNITY): Payer: Self-pay | Admitting: Radiology

## 2023-12-08 ENCOUNTER — Inpatient Hospital Stay (HOSPITAL_COMMUNITY)

## 2023-12-08 DIAGNOSIS — R4701 Aphasia: Secondary | ICD-10-CM | POA: Diagnosis present

## 2023-12-08 DIAGNOSIS — I615 Nontraumatic intracerebral hemorrhage, intraventricular: Secondary | ICD-10-CM | POA: Diagnosis present

## 2023-12-08 DIAGNOSIS — T465X6A Underdosing of other antihypertensive drugs, initial encounter: Secondary | ICD-10-CM | POA: Diagnosis present

## 2023-12-08 DIAGNOSIS — T447X6A Underdosing of beta-adrenoreceptor antagonists, initial encounter: Secondary | ICD-10-CM | POA: Diagnosis present

## 2023-12-08 DIAGNOSIS — Z8249 Family history of ischemic heart disease and other diseases of the circulatory system: Secondary | ICD-10-CM

## 2023-12-08 DIAGNOSIS — I69391 Dysphagia following cerebral infarction: Secondary | ICD-10-CM | POA: Diagnosis not present

## 2023-12-08 DIAGNOSIS — I619 Nontraumatic intracerebral hemorrhage, unspecified: Secondary | ICD-10-CM | POA: Diagnosis present

## 2023-12-08 DIAGNOSIS — E1122 Type 2 diabetes mellitus with diabetic chronic kidney disease: Secondary | ICD-10-CM | POA: Diagnosis present

## 2023-12-08 DIAGNOSIS — N189 Chronic kidney disease, unspecified: Secondary | ICD-10-CM

## 2023-12-08 DIAGNOSIS — G4489 Other headache syndrome: Secondary | ICD-10-CM | POA: Diagnosis not present

## 2023-12-08 DIAGNOSIS — E876 Hypokalemia: Secondary | ICD-10-CM | POA: Diagnosis not present

## 2023-12-08 DIAGNOSIS — Z79899 Other long term (current) drug therapy: Secondary | ICD-10-CM | POA: Diagnosis not present

## 2023-12-08 DIAGNOSIS — Z91138 Patient's unintentional underdosing of medication regimen for other reason: Secondary | ICD-10-CM | POA: Diagnosis not present

## 2023-12-08 DIAGNOSIS — I618 Other nontraumatic intracerebral hemorrhage: Secondary | ICD-10-CM | POA: Diagnosis not present

## 2023-12-08 DIAGNOSIS — I1310 Hypertensive heart and chronic kidney disease without heart failure, with stage 1 through stage 4 chronic kidney disease, or unspecified chronic kidney disease: Secondary | ICD-10-CM | POA: Diagnosis present

## 2023-12-08 DIAGNOSIS — R1310 Dysphagia, unspecified: Secondary | ICD-10-CM | POA: Diagnosis present

## 2023-12-08 DIAGNOSIS — R4182 Altered mental status, unspecified: Secondary | ICD-10-CM | POA: Diagnosis present

## 2023-12-08 DIAGNOSIS — E669 Obesity, unspecified: Secondary | ICD-10-CM | POA: Diagnosis present

## 2023-12-08 DIAGNOSIS — I129 Hypertensive chronic kidney disease with stage 1 through stage 4 chronic kidney disease, or unspecified chronic kidney disease: Secondary | ICD-10-CM | POA: Diagnosis not present

## 2023-12-08 DIAGNOSIS — N1831 Chronic kidney disease, stage 3a: Secondary | ICD-10-CM | POA: Diagnosis present

## 2023-12-08 DIAGNOSIS — T461X6A Underdosing of calcium-channel blockers, initial encounter: Secondary | ICD-10-CM | POA: Diagnosis present

## 2023-12-08 DIAGNOSIS — Z833 Family history of diabetes mellitus: Secondary | ICD-10-CM

## 2023-12-08 DIAGNOSIS — E785 Hyperlipidemia, unspecified: Secondary | ICD-10-CM | POA: Diagnosis present

## 2023-12-08 DIAGNOSIS — I639 Cerebral infarction, unspecified: Principal | ICD-10-CM | POA: Diagnosis present

## 2023-12-08 DIAGNOSIS — Z841 Family history of disorders of kidney and ureter: Secondary | ICD-10-CM

## 2023-12-08 DIAGNOSIS — R29701 NIHSS score 1: Secondary | ICD-10-CM | POA: Diagnosis not present

## 2023-12-08 DIAGNOSIS — I161 Hypertensive emergency: Principal | ICD-10-CM | POA: Diagnosis present

## 2023-12-08 DIAGNOSIS — Z743 Need for continuous supervision: Secondary | ICD-10-CM | POA: Diagnosis not present

## 2023-12-08 DIAGNOSIS — I517 Cardiomegaly: Secondary | ICD-10-CM | POA: Diagnosis not present

## 2023-12-08 DIAGNOSIS — I10 Essential (primary) hypertension: Secondary | ICD-10-CM | POA: Diagnosis not present

## 2023-12-08 DIAGNOSIS — Z91148 Patient's other noncompliance with medication regimen for other reason: Secondary | ICD-10-CM | POA: Diagnosis not present

## 2023-12-08 DIAGNOSIS — R29700 NIHSS score 0: Secondary | ICD-10-CM | POA: Diagnosis present

## 2023-12-08 DIAGNOSIS — I77810 Thoracic aortic ectasia: Secondary | ICD-10-CM | POA: Diagnosis not present

## 2023-12-08 LAB — CBC WITH DIFFERENTIAL/PLATELET
Abs Immature Granulocytes: 0.01 K/uL (ref 0.00–0.07)
Basophils Absolute: 0 K/uL (ref 0.0–0.1)
Basophils Relative: 1 %
Eosinophils Absolute: 0.1 K/uL (ref 0.0–0.5)
Eosinophils Relative: 2 %
HCT: 47 % (ref 39.0–52.0)
Hemoglobin: 14.5 g/dL (ref 13.0–17.0)
Immature Granulocytes: 0 %
Lymphocytes Relative: 34 %
Lymphs Abs: 1.5 K/uL (ref 0.7–4.0)
MCH: 25.6 pg — ABNORMAL LOW (ref 26.0–34.0)
MCHC: 30.9 g/dL (ref 30.0–36.0)
MCV: 82.9 fL (ref 80.0–100.0)
Monocytes Absolute: 0.4 K/uL (ref 0.1–1.0)
Monocytes Relative: 10 %
Neutro Abs: 2.4 K/uL (ref 1.7–7.7)
Neutrophils Relative %: 53 %
Platelets: 207 K/uL (ref 150–400)
RBC: 5.67 MIL/uL (ref 4.22–5.81)
RDW: 13.2 % (ref 11.5–15.5)
WBC: 4.5 K/uL (ref 4.0–10.5)
nRBC: 0 % (ref 0.0–0.2)

## 2023-12-08 LAB — COMPREHENSIVE METABOLIC PANEL WITH GFR
ALT: 26 U/L (ref 0–44)
AST: 28 U/L (ref 15–41)
Albumin: 4.2 g/dL (ref 3.5–5.0)
Alkaline Phosphatase: 71 U/L (ref 38–126)
Anion gap: 10 (ref 5–15)
BUN: 21 mg/dL — ABNORMAL HIGH (ref 6–20)
CO2: 25 mmol/L (ref 22–32)
Calcium: 8.9 mg/dL (ref 8.9–10.3)
Chloride: 105 mmol/L (ref 98–111)
Creatinine, Ser: 1.59 mg/dL — ABNORMAL HIGH (ref 0.61–1.24)
GFR, Estimated: 55 mL/min — ABNORMAL LOW (ref >60)
Glucose, Bld: 103 mg/dL — ABNORMAL HIGH (ref 70–99)
Potassium: 3.4 mmol/L — ABNORMAL LOW (ref 3.5–5.1)
Sodium: 140 mmol/L (ref 135–145)
Total Bilirubin: 1.6 mg/dL — ABNORMAL HIGH (ref 0.0–1.2)
Total Protein: 7.9 g/dL (ref 6.5–8.1)

## 2023-12-08 LAB — URINALYSIS, ROUTINE W REFLEX MICROSCOPIC
Bilirubin Urine: NEGATIVE
Glucose, UA: NEGATIVE mg/dL
Hgb urine dipstick: NEGATIVE
Ketones, ur: NEGATIVE mg/dL
Leukocytes,Ua: NEGATIVE
Nitrite: NEGATIVE
Protein, ur: NEGATIVE mg/dL
Specific Gravity, Urine: 1.016 (ref 1.005–1.030)
pH: 5 (ref 5.0–8.0)

## 2023-12-08 LAB — MRSA NEXT GEN BY PCR, NASAL: MRSA by PCR Next Gen: NOT DETECTED

## 2023-12-08 LAB — CBG MONITORING, ED: Glucose-Capillary: 83 mg/dL (ref 70–99)

## 2023-12-08 LAB — RAPID URINE DRUG SCREEN, HOSP PERFORMED
Amphetamines: NOT DETECTED
Barbiturates: NOT DETECTED
Benzodiazepines: NOT DETECTED
Cocaine: NOT DETECTED
Opiates: NOT DETECTED
Tetrahydrocannabinol: NOT DETECTED

## 2023-12-08 LAB — LIPASE, BLOOD: Lipase: 34 U/L (ref 11–51)

## 2023-12-08 LAB — TROPONIN I (HIGH SENSITIVITY): Troponin I (High Sensitivity): 17 ng/L (ref <18)

## 2023-12-08 MED ORDER — ACETAMINOPHEN 325 MG PO TABS
650.0000 mg | ORAL_TABLET | ORAL | Status: DC | PRN
Start: 2023-12-08 — End: 2023-12-13
  Administered 2023-12-09 – 2023-12-12 (×5): 650 mg via ORAL
  Filled 2023-12-08 (×4): qty 2

## 2023-12-08 MED ORDER — CHLORHEXIDINE GLUCONATE CLOTH 2 % EX PADS
6.0000 | MEDICATED_PAD | Freq: Every day | CUTANEOUS | Status: DC
  Administered 2023-12-08 – 2023-12-10 (×3): 6 via TOPICAL

## 2023-12-08 MED ORDER — SENNOSIDES-DOCUSATE SODIUM 8.6-50 MG PO TABS
1.0000 | ORAL_TABLET | Freq: Two times a day (BID) | ORAL | Status: DC
  Administered 2023-12-09 – 2023-12-13 (×12): 1 via ORAL
  Filled 2023-12-08 (×8): qty 1

## 2023-12-08 MED ORDER — ACETAMINOPHEN 650 MG RE SUPP: 650.0000 mg | RECTAL | Status: DC | PRN

## 2023-12-08 MED ORDER — IOHEXOL 350 MG/ML SOLN
75.0000 mL | Freq: Once | INTRAVENOUS | Status: AC | PRN
  Administered 2023-12-08: 75 mL via INTRAVENOUS

## 2023-12-08 MED ORDER — STROKE: EARLY STAGES OF RECOVERY BOOK: Freq: Once | Status: AC

## 2023-12-08 MED ORDER — POTASSIUM CHLORIDE 20 MEQ PO PACK
60.0000 meq | PACK | Freq: Once | ORAL | Status: DC
  Filled 2023-12-08: qty 3

## 2023-12-08 MED ORDER — CLEVIDIPINE BUTYRATE 0.5 MG/ML IV EMUL
0.0000 mg/h | INTRAVENOUS | Status: DC
  Administered 2023-12-08: 16 mg/h via INTRAVENOUS
  Administered 2023-12-08: 2 mg/h via INTRAVENOUS
  Administered 2023-12-09: 20 mg/h via INTRAVENOUS
  Administered 2023-12-09: 13 mg/h via INTRAVENOUS
  Administered 2023-12-09 (×2): 18 mg/h via INTRAVENOUS
  Administered 2023-12-09: 21 mg/h via INTRAVENOUS
  Administered 2023-12-09: 11 mg/h via INTRAVENOUS
  Administered 2023-12-09: 7 mg/h via INTRAVENOUS
  Administered 2023-12-10: 5 mg/h via INTRAVENOUS
  Administered 2023-12-10: 7 mg/h via INTRAVENOUS
  Administered 2023-12-10: 4 mg/h via INTRAVENOUS
  Administered 2023-12-10: 5 mg/h via INTRAVENOUS
  Administered 2023-12-11 (×2): 3 mg/h via INTRAVENOUS
  Filled 2023-12-08: qty 50
  Filled 2023-12-08 (×3): qty 100
  Filled 2023-12-08 (×3): qty 50
  Filled 2023-12-08 (×6): qty 100
  Filled 2023-12-08: qty 50
  Filled 2023-12-08 (×2): qty 100

## 2023-12-08 MED ORDER — NICARDIPINE HCL IN NACL 20-0.86 MG/200ML-% IV SOLN
3.0000 mg/h | INTRAVENOUS | Status: DC
  Administered 2023-12-08: 5 mg/h via INTRAVENOUS
  Filled 2023-12-08: qty 200

## 2023-12-08 MED ORDER — PANTOPRAZOLE SODIUM 40 MG IV SOLR: 40.0000 mg | Freq: Every day | INTRAVENOUS | Status: DC

## 2023-12-08 MED ORDER — ACETAMINOPHEN 160 MG/5ML PO SOLN: 650.0000 mg | ORAL | Status: DC | PRN

## 2023-12-08 NOTE — ED Notes (Signed)
Called [facility] for transport 

## 2023-12-08 NOTE — ED Triage Notes (Signed)
 Pt has c/o fatigue, confusion, and headache. Pt is A&O, but slow to respond and states he feels loose. BP elevated in triage, pt states he has not taken his medication today.

## 2023-12-08 NOTE — Progress Notes (Addendum)
 Patient transferred from [facility] to [facility], [phone].  Patient is alert and orient.  See documentation for [facility].  Neurology notified and assess patient at bedside.  [facility] screening passed.   Belongings include phone, pants, and a pair of shoes.

## 2023-12-08 NOTE — ED Provider Notes (Signed)
 [place] EMERGENCY DEPARTMENT AT [facility] Ed Provider Note   CSN: [id number] Arrival date & time: 12/08/23  1442     Patient presents with: Altered Mental Status   Charles Higgins is a 43 y.o. male.   43 y/o male presenting emergency department for confusion.  Normal state of health yesterday, although wife notes that he vomited upon waking yesterday for.  Today he reports that he woke up normal.  Around 9:00 started having some confusion and difficulty completing tasks.  Got his kids ready to go to school.  He has a difficult time elaborating, but stated he was forgetful and confused.  He denies vision loss, facial droop, aphasia, dysarthria, reports he may have been weak, but he is not quite sure, no vertigo or balance disorders.  Does note minor frontal headache currently.  He is hypertensive, reports he has not been taking his antihypertensives.  No other medications.  Reports that he is feeling better, but continues to feel off and how he did think if he was taking drugs, but adamantly denies any drugs or alcohol use this morning.   Altered Mental Status      Prior to Admission medications   Medication Sig Start Date End Date Taking? Authorizing Provider  amLODipine  (NORVASC ) 10 MG tablet TAKE 1 TABLET BY MOUTH EVERY DAY Patient not taking: Reported on 12/08/2023 08/07/21   Perri Ronal PARAS, MD  cloNIDine  (CATAPRES ) 0.1 MG tablet TAKE 1 TABLET BY MOUTH TWICE A DAY Patient not taking: Reported on 12/08/2023 06/22/21   Perri Ronal PARAS, MD  gabapentin (NEURONTIN) 300 MG capsule Take 300 mg by mouth at bedtime as needed (neuropathy). Patient not taking: Reported on 12/08/2023    [provider]  losartan -hydrochlorothiazide  (HYZAAR) 100-25 MG tablet TAKE 1 TABLET BY MOUTH EVERY DAY Patient not taking: Reported on 12/08/2023 11/22/21   Perri Ronal PARAS, MD  metoprolol  succinate (TOPROL -XL) 100 MG 24 hr tablet TAKE 1 TABLET BY MOUTH EVERY DAY Patient not taking: Reported on 12/08/2023  08/07/21   Perri Ronal PARAS, MD  rosuvastatin  (CRESTOR ) 5 MG tablet Take 1 tablet (5 mg total) by mouth daily. Patient not taking: Reported on 12/08/2023 03/22/22   Perri Ronal PARAS, MD  spironolactone  (ALDACTONE ) 25 MG tablet Take 1 tablet (25 mg total) by mouth daily. Patient not taking: Reported on 12/08/2023 03/22/22   Perri Ronal PARAS, MD  tiZANidine (ZANAFLEX) 4 MG tablet Take 4 mg by mouth at bedtime as needed for muscle spasms. Patient not taking: Reported on 12/08/2023 06/22/23   [provider]    Allergies: Patient has no known allergies.    Review of Systems  Updated Vital Signs BP 124/68   Pulse 67   Temp 97.7 F (36.5 C) (Axillary)   Resp (!) 21   SpO2 92%   Physical Exam Vitals and nursing note reviewed.  Constitutional:      General: He is not in acute distress.    Appearance: He is not toxic-appearing.  HENT:     Head: Normocephalic and atraumatic.     Nose: Nose normal.     Mouth/Throat:     Mouth: Mucous membranes are dry.  Eyes:     Extraocular Movements: Extraocular movements intact.     Conjunctiva/sclera: Conjunctivae normal.  Cardiovascular:     Rate and Rhythm: Normal rate and regular rhythm.  Pulmonary:     Effort: Pulmonary effort is normal.     Breath sounds: Normal breath sounds.  Abdominal:  General: Abdomen is flat. There is no distension.     Tenderness: There is no abdominal tenderness. There is no guarding or rebound.  Musculoskeletal:        General: Normal range of motion.     Cervical back: Normal range of motion.  Skin:    General: Skin is warm and dry.     Capillary Refill: Capillary refill takes less than 2 seconds.  Neurological:     Mental Status: He is alert and oriented to person, place, and time.     Cranial Nerves: No cranial nerve deficit.     Sensory: No sensory deficit.     Motor: No weakness.     Coordination: Coordination normal.     Comments: NIH is 0  Psychiatric:        Mood and Affect: Mood normal.         Behavior: Behavior normal.     (all labs ordered are listed, but only abnormal results are displayed) Labs Reviewed  CBC WITH DIFFERENTIAL/PLATELET - Abnormal; Notable for the following components:      Result Value   MCH 25.6 (*)    All other components within normal limits  COMPREHENSIVE METABOLIC PANEL WITH GFR - Abnormal; Notable for the following components:   Potassium 3.4 (*)    Glucose, Bld 103 (*)    BUN 21 (*)    Creatinine, Ser 1.59 (*)    Total Bilirubin 1.6 (*)    GFR, Estimated 55 (*)    All other components within normal limits  URINALYSIS, ROUTINE W REFLEX MICROSCOPIC - Abnormal; Notable for the following components:   Color, Urine STRAW (*)    All other components within normal limits  MRSA NEXT GEN BY PCR, NASAL  LIPASE, BLOOD  RAPID URINE DRUG SCREEN, HOSP PERFORMED  HIV ANTIBODY (ROUTINE TESTING W REFLEX)  HEMOGLOBIN A1C  LIPID PANEL  CBG MONITORING, ED  TROPONIN I (HIGH SENSITIVITY)  TROPONIN I (HIGH SENSITIVITY)    EKG: EKG Interpretation Date/Time:  Friday December 08 2023 14:50:39 EDT Ventricular Rate:  65 PR Interval:  154 QRS Duration:  105 QT Interval:  411 QTC Calculation: 428 R Axis:   -1  Text Interpretation: Sinus rhythm Probable left atrial enlargement LVH with secondary repolarization abnormality Confirmed by Neysa Clap [phone]) on 12/08/2023 3:12:34 PM  Radiology: CT Head Wo Contrast Result Date: 12/08/2023 CLINICAL DATA:  Mental status change. Fatigue with confusion and headache. Elevated blood pressure in triage. EXAM: CT HEAD WITHOUT CONTRAST TECHNIQUE: Contiguous axial images were obtained from the base of the skull through the vertex without intravenous contrast. RADIATION DOSE REDUCTION: This exam was performed according to the departmental dose-optimization program which includes automated exposure control, adjustment of the mA and/or kV according to patient size and/or use of iterative reconstruction technique. COMPARISON:  None  Available. FINDINGS: Brain: There is focus of acute hemorrhage over the superior aspect of the left thalamus and extending into the left lateral ventricle. This measures approximately 2.9 x 1.2 cm in AP and transverse dimension over the superior aspect of the left thalamus. There is mild local mass effect. No midline shift. Ventricles are otherwise normal and size. Remaining cisterns and CSF spaces are unremarkable. Vascular: No hyperdense vessel or unexpected calcification. Skull: Normal. Negative for fracture or focal lesion. Sinuses/Orbits: Mild chronic inflammatory change over the sinuses most notable over the left maxillary sinus. Visualized orbits are unremarkable. Mastoid air cells are clear. Other: None. IMPRESSION: 1. Acute hemorrhage over the superior aspect of  the left thalamus and extending into the left lateral ventricle likely hypertensive in etiology. Critical Value/emergent results were called by telephone at the time of interpretation on 12/08/2023 at 4:09 pm to provider [facility] , who verbally acknowledged these results. Electronically Signed   By: Toribio Agreste M.D.   On: 12/08/2023 16:09   DG Chest Portable 1 View Result Date: 12/08/2023 EXAM: 1 VIEW XRAY OF THE CHEST 12/08/2023 03:24:00 PM COMPARISON: 03/09/2021 CLINICAL HISTORY: AMS. AMS AMS. AMS FINDINGS: LUNGS AND PLEURA: No focal pulmonary opacity. No pulmonary edema. No pleural effusion. No pneumothorax. HEART AND MEDIASTINUM: No acute abnormality of the cardiac and mediastinal silhouettes. BONES AND SOFT TISSUES: No acute osseous abnormality. IMPRESSION: 1. No acute process. Electronically signed by: Dayne Hassell MD 12/08/2023 03:37 PM EDT RP Workstation: [id number]     .Critical Care  Performed by: Neysa Caron PARAS, DO Authorized by: Neysa Caron PARAS, DO   Critical care provider statement:    Critical care time (minutes):  75   Critical care was necessary to treat or prevent imminent or life-threatening deterioration of the  following conditions:  CNS failure or compromise   Critical care was time spent personally by me on the following activities:  Development of treatment plan with patient or surrogate, discussions with consultants, evaluation of patient's response to treatment, examination of patient, ordering and review of laboratory studies, ordering and review of radiographic studies, ordering and performing treatments and interventions, pulse oximetry, re-evaluation of patient's condition and review of old charts    Medications Ordered in the ED  clevidipine  (CLEVIPREX ) infusion 0.5 mg/mL (16 mg/hr Intravenous New Bag/Given 12/08/23 2108)  Chlorhexidine  Gluconate Cloth 2 % PADS 6 each (6 each Topical Given 12/08/23 1853)   stroke: early stages of recovery book (has no administration in time range)  acetaminophen  (TYLENOL ) tablet 650 mg (has no administration in time range)    Or  acetaminophen  (TYLENOL ) 160 MG/5ML solution 650 mg (has no administration in time range)    Or  acetaminophen  (TYLENOL ) suppository 650 mg (has no administration in time range)  senna-docusate (Senokot-S) tablet 1 tablet (has no administration in time range)  pantoprazole  (PROTONIX ) injection 40 mg (has no administration in time range)  potassium chloride  (KLOR-CON ) packet 60 mEq (has no administration in time range)    Clinical Course as of 12/08/23 2258  Fri Dec 08, 2023  1608 Received call from radiology patient with intracranial hemorrhage thalamus [TY]  1616 CT Head Wo Contrast INDINGS: Brain: There is focus of acute hemorrhage over the superior aspect of the left thalamus and extending into the left lateral ventricle. This measures approximately 2.9 x 1.2 cm in AP and transverse dimension over the superior aspect of the left thalamus. There is mild local mass effect. No midline shift. Ventricles are otherwise normal and size. Remaining cisterns and CSF spaces are unremarkable.  Vascular: No hyperdense vessel or unexpected  calcification.   [TY]  1616 HOB elevated. Nicardipine  ordered for BP. NSGY and neurology consulted.  [TY]  1631 Patient and family updated on CT findings. Continues to mentate well and without focal deficits on re-exam. [TY]  1634 NSGY recommending admit to stroke team. No interventions needed on their part.  [TY]  [id number] Spoke with neurology, will review images and call back [TY]  1725 Accepted neuro icu. Admit orders [TY]    Clinical Course User Index [TY] Neysa Caron PARAS, DO  Medical Decision Making This is a 43 year old male with history of hypertension, hyperlipidemia, CKD presenting emergency department for altered mental status/confusion.  Quite hypertensive blood pressure 204/156 on arrival, nontachycardic and afebrile.  Physical exam without localizing neurodeficits.  Wife at bedside states that he still is not back to baseline as he still is still somewhat confused.  He is able to carry on a full conversation with me alert and oriented x 3, does appear to have some mildly slow responses however.  Unclear etiology for symptoms, stroke/TIA, versus hypertensive urgency.  His last known normal however approximately 9:00 this morning.  Outside of window for stroke activation.  Van negative.  Unfortunately CT head revealed hemorrhagic stroke.  Discussed case with neurosurgery; see ED course no interventions.  Discussed with neurology, recommending blood pressure control and admission.  He was initially started on nicardipine , but neurology recommending to switch to clevidipine .  Admit to neurology service  Amount and/or Complexity of Data Reviewed Independent Historian:     Details: Family notes not at baseline.  External Data Reviewed:     Details: No prior imaging.  Appears to have controlled diabetes per PCP note.  However blood sugar normal today. Not on blood thinner Labs: ordered. Decision-making details documented in ED Course.    Details: Minor  elevation in creatinine no leukocytosis, no anemia. Radiology: ordered and independent interpretation performed. Decision-making details documented in ED Course.    Details: Appreciate bleed into ventricle.  ECG/medicine tests: independent interpretation performed. Decision-making details documented in ED Course. Discussion of management or test interpretation with external provider(s): NSGY, nuerology.   Risk Drug therapy requiring intensive monitoring for toxicity. Decision regarding hospitalization. Diagnosis or treatment significantly limited by social determinants of health.       Final diagnoses:  None    ED Discharge Orders     None          Neysa Caron PARAS, DO 12/08/23 2258

## 2023-12-08 NOTE — Plan of Care (Signed)
 Thalamic ICH with IVH. Exam reassuing per EDP Hypertensive on cardene . Accept to neurology service 4N ICU at [facility]. Change med to Clevi Will see when at [facility]    -- Eligio Lav, MD Neurologist [facility]

## 2023-12-08 NOTE — ED Notes (Signed)
 Report given to [place], [place]

## 2023-12-08 NOTE — H&P (Signed)
 NEUROLOGY H&P NOTE   Date of service: December 08, 2023 Patient Name: Charles Higgins MRN:  [id number] DOB:  [birth year] Chief Complaint: AMS  History of Present Illness  Charles Higgins is a 43 y.o. male with hx of HTN, DM, HLD, and CKD presenting with altered mental status and confusion. On arrival to the ED his blood pressure was 204/156.  Reports some word finding difficulty.  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 No prior history of stroke but reports noncompliance to medications for his hypertension.   Last known well: 0900 hrs. Modified rankin score: 0-Completely asymptomatic and back to baseline post- stroke ICH Score: 1 tNKASE: Not offered due to ICH Thrombectomy: not offered due to ICH NIH stroke scale-0   ROS  Comprehensive ROS performed and pertinent positives documented in the HPI.  Past History   Past Medical History:  Diagnosis Date   Controlled diabetes mellitus type II without complication (HCC) 08/25/2013   Elevated serum creatinine 08/25/2013   Hyperlipidemia 06/23/2011   Hypertension 03/10/2011   Metabolic syndrome 06/28/2015   Umbilical hernia 06/23/2011   Past Surgical History:  Procedure Laterality Date   KNEE SURGERY     High school   NO PAST SURGERIES     Family History  Problem Relation Age of Onset   Diabetes Father    Hypertension Father    Kidney failure Father    CAD Father    Hypertension Mother    Diabetes Maternal Grandmother    Social History   Socioeconomic History   Marital status: Married    Spouse name: Not on file   Number of children: Not on file   Years of education: Not on file   Highest education level: Not on file  Occupational History   Occupation: Insurance    Comment: Community education officer  Tobacco Use   Smoking status: Never   Smokeless tobacco: Never  Vaping Use   Vaping status: Never Used  Substance and Sexual Activity   Alcohol use: No   Drug use: No   Sexual  activity: Yes  Other Topics Concern   Not on file  Social History Narrative   Has one 32 year old son and one on the way.   Social Drivers of Corporate investment banker Strain: Not on file  Food Insecurity: Not on file  Transportation Needs: Not on file  Physical Activity: Not on file  Stress: Not on file  Social Connections: Not on file   No Known Allergies  Medications   Medications Prior to Admission  Medication Sig Dispense Refill Last Dose/Taking   amLODipine  (NORVASC ) 10 MG tablet TAKE 1 TABLET BY MOUTH EVERY DAY (Patient not taking: Reported on 12/08/2023) 90 tablet 0 Not Taking   cloNIDine  (CATAPRES ) 0.1 MG tablet TAKE 1 TABLET BY MOUTH TWICE A DAY (Patient not taking: Reported on 12/08/2023) 180 tablet 1 Not Taking   gabapentin (NEURONTIN) 300 MG capsule Take 300 mg by mouth at bedtime as needed (neuropathy). (Patient not taking: Reported on 12/08/2023)   Not Taking   losartan -hydrochlorothiazide  (HYZAAR) 100-25 MG tablet TAKE 1 TABLET BY MOUTH EVERY DAY (Patient not taking: Reported on 12/08/2023) 90 tablet 1 Not Taking   metoprolol  succinate (TOPROL -XL) 100 MG 24 hr tablet TAKE 1 TABLET BY MOUTH EVERY DAY (Patient not taking: Reported on 12/08/2023) 90 tablet 0 Not Taking   rosuvastatin  (CRESTOR ) 5 MG tablet Take 1 tablet (5 mg total) by mouth daily. (Patient not taking:  Reported on 12/08/2023) 90 tablet 3 Not Taking   spironolactone  (ALDACTONE ) 25 MG tablet Take 1 tablet (25 mg total) by mouth daily. (Patient not taking: Reported on 12/08/2023) 90 tablet 3 Not Taking   tiZANidine (ZANAFLEX) 4 MG tablet Take 4 mg by mouth at bedtime as needed for muscle spasms. (Patient not taking: Reported on 12/08/2023)   Not Taking     Vitals   Vitals:   12/08/23 1750 12/08/23 1800 12/08/23 1815 12/08/23 1855  BP: (!) 169/108 (!) 165/102 (!) 161/146 (!) 151/68  Pulse: 64 64 66 68  Resp: (!) 22 (!) 21 (!) 26 18  Temp:      SpO2: 93% 95% 98% 97%     There is no height or weight on file to  calculate BMI.  Physical Exam   General: Well-developed well-nourished man in no acute distress HEENT: Normocephalic atraumatic Lungs: Clear Cardiovascular: Regular rate rhythm Abdomen nondistended nontender Extremities warm well-perfused Neurological exam Awake alert oriented x 3.  No evidence of dysarthria.  No evidence of aphasia on stroke card testing but while having long conversations, he does seem to have very subtle word finding difficulty. Cranial nerves II to XII intact Motor examination with no drift in any of the 4 extremities Sensation intact light touch Coordination examination reveals no evidence of dysmetria NIH stroke scale is a 0 since he had no trouble with the NIH stroke scale cards but on longer conversations with him I did notice very subtle aphasia-question thalamic aphasia    Labs   CBC:  Recent Labs  Lab 12/08/23 1506  WBC 4.5  NEUTROABS 2.4  HGB 14.5  HCT 47.0  MCV 82.9  PLT 207    Basic Metabolic Panel:  Lab Results  Component Value Date   NA 140 12/08/2023   K 3.4 (L) 12/08/2023   CO2 25 12/08/2023   GLUCOSE 103 (H) 12/08/2023   BUN 21 (H) 12/08/2023   CREATININE 1.59 (H) 12/08/2023   CALCIUM  8.9 12/08/2023   GFRNONAA 55 (L) 12/08/2023   GFRAA 81 01/31/2019   Lipid Panel:  Lab Results  Component Value Date   LDLCALC 147 (H) 09/14/2021   HgbA1c:  Lab Results  Component Value Date   HGBA1C 6.1 (H) 03/22/2022   Urine Drug Screen:     Component Value Date/Time   LABOPIA NONE DETECTED 12/08/2023 1717   COCAINSCRNUR NONE DETECTED 12/08/2023 1717   LABBENZ NONE DETECTED 12/08/2023 1717   AMPHETMU NONE DETECTED 12/08/2023 1717   THCU NONE DETECTED 12/08/2023 1717   LABBARB NONE DETECTED 12/08/2023 1717      CT Head without contrast(Personally reviewed): Acute hemorrhage over the superior aspect of the left thalamus and extending into the left lateral ventricle likely hypertensive in etiology.  CT angio Head and Neck with  contrast(Personally reviewed): Ordered for 2200  Impression   Charles Higgins is a 43 y.o. male past history of diabetes hypertension hyperlipidemia CKD noncompliant to medications coming in with confusion that started this morning.  Woke up normal.  Last known well at 0900 hrs. CT head with left thalamic hemorrhage with IVH. Blood pressures were high-required nicardipine  at outside hospital.  I accepted him for admission and recommended Cleviprex  which he was started on.  Blood pressures under better control with Cleviprex .    Primary Diagnosis:  Thalamic ICH with IVH-etiology hypertension Hypertensive emergency  Secondary Diagnosis: Pretension, hyperlipidemia, CKD, diabetes  Recommendations  Admit to ICU Frequent neurochecks Telemetry No antiplatelets or anticoagulants Repeat head CT and  CT at 2200 hrs. MRI tomorrow morning at 800 hours 2D echo, A1c, lipid panel Check labs in the morning Replete electrolytes as necessary Gentle hydration On Cleviprex -blood pressure goal 130-150 systolic. N.p.o. until cleared by bedside swallow evaluation.  If fails bedside swallow, may need formal swallow evaluation by SLP ______________________________________________________________________   Jorene Last, NP [facility]    Attending Neurohospitalist Addendum Patient seen and examined with APP/Resident. Agree with the history and physical as documented above. Agree with the plan as documented, which I helped formulate. I have independently reviewed the chart, obtained history, review of systems and examined the patient.I have personally reviewed pertinent head/neck/spine imaging (CT/MRI).  Plan discussed with Dr. Neysa over the phone Please feel free to call with any questions.  -- Eligio Lav, MD Neurologist [facility] Pager: [phone]  CRITICAL CARE ATTESTATION Performed by: Eligio Lav, MD Total critical care time: 35 minutes Critical care time  was exclusive of separately billable procedures and treating other patients and/or supervising APPs/Residents/Students Critical care was necessary to treat or prevent imminent or life-threatening deterioration. This patient is critically ill and at significant risk for neurological worsening and/or death and care requires constant monitoring. Critical care was time spent personally by me on the following activities: development of treatment plan with patient and/or surrogate as well as nursing, discussions with consultants, evaluation of patient's response to treatment, examination of patient, obtaining history from patient or surrogate, ordering and performing treatments and interventions, ordering and review of laboratory studies, ordering and review of radiographic studies, pulse oximetry, re-evaluation of patient's condition, participation in multidisciplinary rounds and medical decision making of high complexity in the care of this patient.

## 2023-12-09 ENCOUNTER — Inpatient Hospital Stay (HOSPITAL_COMMUNITY)

## 2023-12-09 DIAGNOSIS — I129 Hypertensive chronic kidney disease with stage 1 through stage 4 chronic kidney disease, or unspecified chronic kidney disease: Secondary | ICD-10-CM | POA: Diagnosis not present

## 2023-12-09 DIAGNOSIS — I161 Hypertensive emergency: Secondary | ICD-10-CM | POA: Diagnosis not present

## 2023-12-09 DIAGNOSIS — I69391 Dysphagia following cerebral infarction: Secondary | ICD-10-CM

## 2023-12-09 DIAGNOSIS — I618 Other nontraumatic intracerebral hemorrhage: Secondary | ICD-10-CM | POA: Diagnosis not present

## 2023-12-09 DIAGNOSIS — I517 Cardiomegaly: Secondary | ICD-10-CM

## 2023-12-09 DIAGNOSIS — R29701 NIHSS score 1: Secondary | ICD-10-CM | POA: Diagnosis not present

## 2023-12-09 DIAGNOSIS — I77810 Thoracic aortic ectasia: Secondary | ICD-10-CM | POA: Diagnosis not present

## 2023-12-09 DIAGNOSIS — I615 Nontraumatic intracerebral hemorrhage, intraventricular: Secondary | ICD-10-CM | POA: Diagnosis not present

## 2023-12-09 LAB — ECHOCARDIOGRAM COMPLETE
AR max vel: 1.99 cm2
AV Area VTI: 1.88 cm2
AV Area mean vel: 2 cm2
AV Mean grad: 7.4 mmHg
AV Peak grad: 16.6 mmHg
Ao pk vel: 2.04 m/s
Area-P 1/2: 2.99 cm2
S' Lateral: 3.4 cm

## 2023-12-09 LAB — LIPID PANEL
Cholesterol: 180 mg/dL (ref 0–200)
LDL Cholesterol: UNDETERMINED mg/dL (ref 0–99)
Triglycerides: 3445 mg/dL — ABNORMAL HIGH (ref <150)
VLDL: UNDETERMINED mg/dL (ref 0–40)

## 2023-12-09 LAB — LDL CHOLESTEROL, DIRECT: Direct LDL: 98 mg/dL (ref 0–99)

## 2023-12-09 LAB — HIV ANTIBODY (ROUTINE TESTING W REFLEX): HIV Screen 4th Generation wRfx: NONREACTIVE

## 2023-12-09 MED ORDER — ACETAMINOPHEN-CODEINE 300-30 MG PO TABS
2.0000 | ORAL_TABLET | Freq: Three times a day (TID) | ORAL | Status: DC | PRN
  Administered 2023-12-09 – 2023-12-13 (×12): 2 via ORAL
  Filled 2023-12-09 (×8): qty 2

## 2023-12-09 MED ORDER — HYDRALAZINE HCL 20 MG/ML IJ SOLN
10.0000 mg | INTRAMUSCULAR | Status: DC | PRN
  Administered 2023-12-09 – 2023-12-10 (×2): 20 mg via INTRAVENOUS
  Administered 2023-12-10: 10 mg via INTRAVENOUS
  Administered 2023-12-11 (×2): 20 mg via INTRAVENOUS
  Filled 2023-12-09 (×4): qty 1

## 2023-12-09 MED ORDER — LOSARTAN POTASSIUM 50 MG PO TABS
100.0000 mg | ORAL_TABLET | Freq: Every day | ORAL | Status: DC
  Administered 2023-12-09: 100 mg via ORAL
  Filled 2023-12-09: qty 2

## 2023-12-09 MED ORDER — GADOBUTROL 1 MMOL/ML IV SOLN
10.0000 mL | Freq: Once | INTRAVENOUS | Status: AC | PRN
  Administered 2023-12-09: 10 mL via INTRAVENOUS

## 2023-12-09 MED ORDER — IRBESARTAN 150 MG PO TABS
300.0000 mg | ORAL_TABLET | Freq: Every day | ORAL | Status: DC
  Administered 2023-12-10 – 2023-12-11 (×3): 300 mg via ORAL
  Filled 2023-12-09 (×2): qty 2

## 2023-12-09 MED ORDER — AMLODIPINE BESYLATE 5 MG PO TABS
10.0000 mg | ORAL_TABLET | Freq: Every day | ORAL | Status: DC
  Administered 2023-12-09 – 2023-12-13 (×8): 10 mg via ORAL
  Filled 2023-12-09 (×3): qty 1
  Filled 2023-12-09: qty 2
  Filled 2023-12-09: qty 1

## 2023-12-09 MED ORDER — LABETALOL HCL 5 MG/ML IV SOLN
20.0000 mg | Freq: Once | INTRAVENOUS | Status: AC
  Administered 2023-12-09: 20 mg via INTRAVENOUS
  Filled 2023-12-09: qty 4

## 2023-12-09 MED ORDER — PROCHLORPERAZINE EDISYLATE 10 MG/2ML IJ SOLN
INTRAMUSCULAR | Status: AC
  Filled 2023-12-09: qty 2

## 2023-12-09 MED ORDER — HYDROCHLOROTHIAZIDE 25 MG PO TABS
25.0000 mg | ORAL_TABLET | Freq: Every day | ORAL | Status: DC
  Administered 2023-12-09 – 2023-12-13 (×8): 25 mg via ORAL
  Filled 2023-12-09 (×5): qty 1

## 2023-12-09 MED ORDER — PROCHLORPERAZINE EDISYLATE 10 MG/2ML IJ SOLN
10.0000 mg | Freq: Once | INTRAMUSCULAR | Status: AC
  Administered 2023-12-09: 10 mg via INTRAVENOUS

## 2023-12-09 MED ORDER — PANTOPRAZOLE SODIUM 40 MG PO TBEC
40.0000 mg | DELAYED_RELEASE_TABLET | Freq: Every day | ORAL | Status: DC
  Administered 2023-12-09 – 2023-12-11 (×4): 40 mg via ORAL
  Filled 2023-12-09 (×3): qty 1

## 2023-12-09 MED ORDER — LOSARTAN POTASSIUM-HCTZ 100-25 MG PO TABS: 1.0000 | ORAL_TABLET | Freq: Every day | ORAL | Status: DC

## 2023-12-09 MED ORDER — CLONIDINE HCL 0.1 MG PO TABS: 0.1000 mg | ORAL_TABLET | Freq: Two times a day (BID) | ORAL | Status: DC

## 2023-12-09 NOTE — Evaluation (Signed)
 Physical Therapy Evaluation & Discharge Patient Details Name: Charles Higgins MRN: [id number] DOB: 25-Apr-1981 Today's Date: 12/09/2023  History of Present Illness  43 y.o. male presenting 12/08/23 with AMS and word finding difficulty. MRI: revealed acute left thalamic hemorrhage with intraventricular extension. PMH: HTN, DM, HLD, and CKD  Clinical Impression  Pt presents with condition above. PTA, he was independent without DME, living with his family in a 2-level house with a level entry and his bedroom upstairs. He currently displays deficits in communication/cognition, but otherwise displays intact, WFL, and symmetrical bil lower extremity strength and sensation. He was noted to have some incoordination with rapid alternating L ankle dorsiflexion/plantarflexion but his coordination was otherwise intact in his bil legs. He was able to mobilize safely without LOB, AD, or need for physical assistance, including navigating stairs and ambulating with dynamic gait challenges. He appears and reports to be performing all functional mobility at his baseline. Will defer cognitive and communication deficits to OT and SLP. All education completed and questions answered. No further skilled PT services appear necessary at this time. Will sign off. Thank you for this referral.       If plan is discharge home, recommend the following: Supervision due to cognitive status;Direct supervision/assist for medications management;Direct supervision/assist for financial management;Assist for transportation;Assistance with cooking/housework   Can travel by private vehicle        Equipment Recommendations None recommended by PT  Recommendations for Other Services       Functional Status Assessment Patient has had a recent decline in their functional status and demonstrates the ability to make significant improvements in function in a reasonable and predictable amount of time.     Precautions / Restrictions  Precautions Precautions: Other (comment) Precaution/Restrictions Comments: SBP < 150 goal Restrictions Weight Bearing Restrictions Per Provider Order: No      Mobility  Bed Mobility Overal bed mobility: Modified Independent             General bed mobility comments: HOB elevated, pt able to exit L EOB without assistance    Transfers Overall transfer level: Independent Equipment used: None Transfers: Sit to/from Stand Sit to Stand: Independent           General transfer comment: Pt able to stand from EOB without LOB or need for assistance    Ambulation/Gait Ambulation/Gait assistance: Independent Gait Distance (Feet): 120 Feet Assistive device: None Gait Pattern/deviations: WFL(Within Functional Limits) Gait velocity: WFL     General Gait Details: No drastic gait deviations or LOB noted, even with dynamic gait challenges, like changing directions, changing head positions, and changing speeds.  Stairs Stairs: Yes Stairs assistance: Supervision Stair Management: No rails, Alternating pattern, Forwards Number of Stairs: 3 General stair comments: Ascends and descends stairs with alternating pattern without LOB or need for assistance, supervision for safety only  Wheelchair Mobility     Tilt Bed    Modified Rankin (Stroke Patients Only) Modified Rankin (Stroke Patients Only) Pre-Morbid Rankin Score: No symptoms Modified Rankin: Slight disability     Balance Overall balance assessment: No apparent balance deficits (not formally assessed)                                           Pertinent Vitals/Pain Pain Assessment Pain Assessment: No/denies pain    Home Living Family/patient expects to be discharged to:: Private residence Living Arrangements: Children;Spouse/significant other Available Help  at Discharge: Family;Available PRN/intermittently Type of Home: House Home Access: Level entry     Alternate Level Stairs-Number of  Steps: full flight - HR is off currently as patient was painting the stairwell. Home Layout: Two level;Bed/bath upstairs Home Equipment: Shower seat - built in      Prior Function Prior Level of Function : Independent/Modified Independent;Working/employed;Driving                     Extremity/Trunk Assessment   Upper Extremity Assessment Upper Extremity Assessment: Defer to OT evaluation    Lower Extremity Assessment Lower Extremity Assessment: LLE deficits/detail LLE Deficits / Details: noted incoordinated with rapid alternating L ankle plantarflexion/dorsiflexion, but otherwise coordination intact bil; sensation intact bil; strength symmetrical and WFL, grossly 5/5 MMT bil LLE Sensation: WNL LLE Coordination: decreased fine motor    Cervical / Trunk Assessment Cervical / Trunk Assessment: Normal  Communication   Communication Communication: Impaired Factors Affecting Communication: Difficulty expressing self    Cognition Arousal: Alert Behavior During Therapy: WFL for tasks assessed/performed   PT - Cognitive impairments: Attention, Memory, Safety/Judgement                       PT - Cognition Comments: Pt noted to need increased time to express himself, stating continued difficulty finding words. He was unable to recall much of prior therapy sessions this date, noting some memory deficits. Following commands: Intact       Cueing Cueing Techniques: Verbal cues     General Comments General comments (skin integrity, edema, etc.): Educated pt on BE FAST; BP - 139/76 (93) supine start of session, 155/81 (102) sitting EOB, 136/86 (101) standing, 160/86 sitting EOB end of session - RN notified    Exercises     Assessment/Plan    PT Assessment Patient does not need any further PT services  PT Problem List         PT Treatment Interventions      PT Goals (Current goals can be found in the Care Plan section)  Acute Rehab PT Goals Patient Stated  Goal: to return to baseline PT Goal Formulation: All assessment and education complete, DC therapy Time For Goal Achievement: 12/10/23 Potential to Achieve Goals: Good    Frequency       Co-evaluation               AM-PAC PT 6 Clicks Mobility  Outcome Measure Help needed turning from your back to your side while in a flat bed without using bedrails?: None Help needed moving from lying on your back to sitting on the side of a flat bed without using bedrails?: None Help needed moving to and from a bed to a chair (including a wheelchair)?: None Help needed standing up from a chair using your arms (e.g., wheelchair or bedside chair)?: None Help needed to walk in hospital room?: None Help needed climbing 3-5 steps with a railing? : A Little 6 Click Score: 23    End of Session Equipment Utilized During Treatment: Gait belt Activity Tolerance: Patient tolerated treatment well Patient left: in bed;with call bell/phone within reach;with family/visitor present (sitting EOB) Nurse Communication: Mobility status;Other (comment) (BP throughout session; pt sitting EOB end of session without bed alarm active, RN ok with this and monitoring pt) PT Visit Diagnosis: Other symptoms and signs involving the nervous system (M70.101)    Time: 8744-8687 PT Time Calculation (min) (ACUTE ONLY): 17 min   Charges:   PT Evaluation $  PT Eval Low Complexity: 1 Low   PT General Charges $$ ACUTE PT VISIT: 1 Visit         Theo Ferretti, PT, DPT Acute Rehabilitation Services  Office: [phone]   Theo CHRISTELLA Ferretti 12/09/2023, 1:33 PM

## 2023-12-09 NOTE — Evaluation (Signed)
 Speech Language Pathology Evaluation Patient Details Name: Charles Higgins MRN: [id number] DOB: 03-24-1981 Today's Date: 12/09/2023 Time: 8880-8845 SLP Time Calculation (min) (ACUTE ONLY): 35 min  Problem List:  Patient Active Problem List   Diagnosis Date Noted   Stroke (cerebrum) (HCC) 12/08/2023   Hemorrhagic stroke (HCC) 12/08/2023   Metabolic syndrome 06/28/2015   Controlled diabetes mellitus type II without complication (HCC) 08/25/2013   Elevated serum creatinine 08/25/2013   Hyperlipidemia 06/23/2011   Umbilical hernia 06/23/2011   Hypertension 03/10/2011   Past Medical History:  Past Medical History:  Diagnosis Date   Controlled diabetes mellitus type II without complication (HCC) 08/25/2013   Elevated serum creatinine 08/25/2013   Hyperlipidemia 06/23/2011   Hypertension 03/10/2011   Metabolic syndrome 06/28/2015   Umbilical hernia 06/23/2011   Past Surgical History:  Past Surgical History:  Procedure Laterality Date   KNEE SURGERY     High school   NO PAST SURGERIES     HPI:  Pt is a 43 y.o. male who presented with AMS and confusion. Reports some word finding difficulty.  CT head (12/08/23) revealed acute left thymic hemorrhage with intraventricular hemorrhage-no evidence of hydrocephalus. PMH: HTN, DM, HLD, and CKD.   Assessment / Plan / Recommendation Clinical Impression  Pt presents with expressive aphasia and suspected mild cognitive communication deficits s/p CVA. 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 In this and other conversations about his home life/hobbies, his speech was circumlocutory with frequent interjections such as um, uh and stuff. Picture description task was also notable for these communication deficits. Auditory comprehension of complex questions, commands and y/n questions was 100%. Reading and  navigating a simple meal ticket was completed with ease, no errors. He wrote his name, address and self- formulated sentence with a total of 3 errors, which he quickly self corrected during task. Question mild cognitive deficits given some difficulty verbally sequencing a familiar task (handy work). While word finding was evident during task, he required some extra processing time and cueing due to disorganized sequencing. Recommend SLP services f/u in order to treat for expressive aphasia and perform ongoing assessment of higher level executive functions given his level of independence and full time emplyment PTA.    SLP Assessment  SLP Recommendation/Assessment: Patient needs continued Speech Language Pathology Services SLP Visit Diagnosis: Aphasia (R47.01);Cognitive communication deficit (R41.841)     Assistance Recommended at Discharge     Functional Status Assessment Patient has had a recent decline in their functional status and demonstrates the ability to make significant improvements in function in a reasonable and predictable amount of time.  Frequency and Duration min 2x/week  2 weeks      SLP Evaluation Cognition  Overall Cognitive Status: Impaired/Different from baseline Arousal/Alertness: Awake/alert Orientation Level: Oriented to person;Oriented to situation Attention: Sustained Sustained Attention: Appears intact Memory: Appears intact Awareness: Appears intact Executive Function: Sequencing Sequencing: Impaired Sequencing Impairment: Verbal complex       Comprehension  Auditory Comprehension Overall Auditory Comprehension: Appears within functional limits for tasks assessed Yes/No Questions: Within Functional Limits Commands: Within Functional Limits Conversation: Complex Visual Recognition/Discrimination Discrimination: Within Function Limits Reading Comprehension Reading Status: Within funtional limits    Expression Expression Primary Mode of Expression:  Verbal Verbal Expression Overall Verbal Expression: Impaired Initiation: No impairment Automatic Speech: Name;Social Response Level of Generative/Spontaneous Verbalization: Conversation Other Verbal Expression  Comments: word finding in conversation, verbal sequencing tasks Written Expression Written Expression: Within Functional Limits   Oral / Motor  Oral Motor/Sensory Function Overall Oral Motor/Sensory Function: Within functional limits Motor Speech Overall Motor Speech: Appears within functional limits for tasks assessed             Charles Kin, MA, CCC-SLP Acute Rehabilitation Services Office Number: [phone]  Charles Higgins 12/09/2023, 12:43 PM

## 2023-12-09 NOTE — Progress Notes (Signed)
 Patient projectile vomited, med order received and given.

## 2023-12-09 NOTE — Evaluation (Signed)
 Occupational Therapy Evaluation Patient Details Name: Charles Higgins MRN: [id number] DOB: 26-Feb-1981 Today's Date: 12/09/2023   History of Present Illness   43 y.o. male adm 12/08/23 with hx of HTN, DM, HLD, and CKD presenting with altered mental status and confusion.  MRI: revealed acute  left thalamic hemorrhage with intraventricular extension.     Clinical Impressions Patient admitted for the diagnosis above.  PTA he lives at home with his spouse and three children.  Patient independent with all aspects of ADL,iADL, mobility, works full time.  Currently he is moving well, but complains of short term memory, difficulty expressing himself - ? Work finding, and feels he's having trouble with organization.  OT will follow in the acute setting to continue to ascertain cognitive status, and assist with any deficits.  Outpatient cognitive rehab can be considered depending on progress.       If plan is discharge home, recommend the following:   Assist for transportation;Direct supervision/assist for financial management     Functional Status Assessment   Patient has had a recent decline in their functional status and demonstrates the ability to make significant improvements in function in a reasonable and predictable amount of time.     Equipment Recommendations   None recommended by OT     Recommendations for Other Services         Precautions/Restrictions   Precautions Precautions: Fall Restrictions Weight Bearing Restrictions Per Provider Order: No     Mobility Bed Mobility Overal bed mobility: Modified Independent                  Transfers Overall transfer level: Needs assistance   Transfers: Sit to/from Stand, Bed to chair/wheelchair/BSC Sit to Stand: Modified independent (Device/Increase time)     Step pivot transfers: Supervision     General transfer comment: No LOB noted      Balance Overall balance assessment: Mild deficits observed, not  formally tested                                         ADL either performed or assessed with clinical judgement   ADL       Grooming: Supervision/safety               Lower Body Dressing: Supervision/safety   Toilet Transfer: Supervision/safety                   Vision Patient Visual Report: No change from baseline       Perception Perception: Within Functional Limits       Praxis Praxis: WFL       Pertinent Vitals/Pain Pain Assessment Pain Assessment: No/denies pain     Extremity/Trunk Assessment Upper Extremity Assessment Upper Extremity Assessment: Overall WFL for tasks assessed   Lower Extremity Assessment Lower Extremity Assessment: Defer to PT evaluation   Cervical / Trunk Assessment Cervical / Trunk Assessment: Normal   Communication Communication Communication: No apparent difficulties   Cognition Arousal: Alert Behavior During Therapy: WFL for tasks assessed/performed Cognition: Cognition impaired       Memory impairment (select all impairments): Short-term memory, Working memory Attention impairment (select first level of impairment): Selective attention Executive functioning impairment (select all impairments): Problem solving, Sequencing                   Following commands: Intact       Cueing  General Comments   Cueing Techniques: Verbal cues;Visual cues   VSS on RA   Exercises     Shoulder Instructions      Home Living Family/patient expects to be discharged to:: Private residence Living Arrangements: Children;Spouse/significant other Available Help at Discharge: Family;Available PRN/intermittently Type of Home: House Home Access: Level entry     Home Layout: Two level;Bed/bath upstairs Alternate Level Stairs-Number of Steps: full flight - HR is off currently as patient was painting the stairwell.   Bathroom Shower/Tub: Tub/shower unit;Walk-in shower   Bathroom Toilet: Standard      Home Equipment: Shower seat - built in          Prior Functioning/Environment Prior Level of Function : Independent/Modified Independent;Working/employed;Driving                    OT Problem List: Decreased cognition   OT Treatment/Interventions: Self-care/ADL training;Therapeutic activities;Cognitive remediation/compensation      OT Goals(Current goals can be found in the care plan section)   Acute Rehab OT Goals Patient Stated Goal: Return home OT Goal Formulation: With patient Time For Goal Achievement: 12/22/23 Potential to Achieve Goals: Good ADL Goals Pt Will Perform Lower Body Dressing: Independently;sit to/from stand Pt Will Transfer to Toilet: Independently;ambulating;regular height toilet Additional ADL Goal #1: Patient will recall 2 to 3 items relating to prior OT session. Additional ADL Goal #2: Patient will follow multi step task with min VC's   OT Frequency:  Min 2X/week    Co-evaluation              AM-PAC OT 6 Clicks Daily Activity     Outcome Measure Help from another person eating meals?: None Help from another person taking care of personal grooming?: None Help from another person toileting, which includes using toliet, bedpan, or urinal?: A Little Help from another person bathing (including washing, rinsing, drying)?: A Little Help from another person to put on and taking off regular upper body clothing?: None Help from another person to put on and taking off regular lower body clothing?: A Little 6 Click Score: 21   End of Session Equipment Utilized During Treatment: Gait belt Nurse Communication: Mobility status  Activity Tolerance: Patient tolerated treatment well Patient left: in chair;with call bell/phone within reach;with family/visitor present  OT Visit Diagnosis: Other symptoms and signs involving cognitive function                Time: 8882-8862 OT Time Calculation (min): 20 min Charges:  OT General Charges $OT  Visit: 1 Visit OT Evaluation $OT Eval Moderate Complexity: 1 Mod  12/09/2023  RP, OTR/L  Acute Rehabilitation Services  Office:  [phone]   Charlie JONETTA Halsted 12/09/2023, 12:17 PM

## 2023-12-09 NOTE — Progress Notes (Signed)
 PT Cancellation Note  Patient Details Name: Charles Higgins MRN: [id number] DOB: Oct 01, 1980   Cancelled Treatment:    Reason Eval/Treat Not Completed: (P) Active bedrest order x24 hours starting at 18:55 on 8/8. Per orders, bedrest will not be lifted until after PT's work day. Will plan to follow-up tomorrow as able.   Theo Ferretti, PT, DPT Acute Rehabilitation Services  Office: [phone]    Theo CHRISTELLA Ferretti 12/09/2023, 8:05 AM

## 2023-12-09 NOTE — Progress Notes (Signed)
  Echocardiogram 2D Echocardiogram has been performed.  Charles Higgins 12/09/2023, 2:58 PM

## 2023-12-09 NOTE — Progress Notes (Addendum)
 STROKE TEAM PROGRESS NOTE    SIGNIFICANT HOSPITAL EVENTS Patient presented with confusion and an elevated blood pressure of systolic BP 204/156.  CT head with ICH, noncompliance with medications  INTERIM HISTORY/SUBJECTIVE Wife is at the bedside.  Patient is sitting up in bed awake and alert.  He is on Cleviprex  at 18 mg.  Have started back his home Norvasc , Hyzaar and clonidine  MRI scan of the brain this morning shows a stable 3.3 cm left posterior corona radiator hemorrhage without significant hydrocephalus.   CBC    Component Value Date/Time   WBC 4.5 12/08/2023 1506   RBC 5.67 12/08/2023 1506   HGB 14.5 12/08/2023 1506   HCT 47.0 12/08/2023 1506   PLT 207 12/08/2023 1506   MCV 82.9 12/08/2023 1506   MCH 25.6 (L) 12/08/2023 1506   MCHC 30.9 12/08/2023 1506   RDW 13.2 12/08/2023 1506   LYMPHSABS 1.5 12/08/2023 1506   MONOABS 0.4 12/08/2023 1506   EOSABS 0.1 12/08/2023 1506   BASOSABS 0.0 12/08/2023 1506    BMET    Component Value Date/Time   NA 140 12/08/2023 1506   NA 140 01/31/2019 0728   K 3.4 (L) 12/08/2023 1506   CL 105 12/08/2023 1506   CO2 25 12/08/2023 1506   GLUCOSE 103 (H) 12/08/2023 1506   BUN 21 (H) 12/08/2023 1506   BUN 16 01/31/2019 0728   CREATININE 1.59 (H) 12/08/2023 1506   CREATININE 1.33 (H) 03/22/2022 0917   CALCIUM  8.9 12/08/2023 1506   EGFR 69 03/22/2022 0917   GFRNONAA 55 (L) 12/08/2023 1506   GFRNONAA 62 11/08/2018 1308    IMAGING past 24 hours MR BRAIN W WO CONTRAST Result Date: 12/09/2023 EXAM: MRI BRAIN WITH AND WITHOUT CONTRAST 12/09/2023 08:21:52 AM TECHNIQUE: Multiplanar multisequence MRI of the head/brain was performed with and without the administration of intravenous contrast. COMPARISON: CT head without contrast 12/08/2023. CLINICAL HISTORY: Stroke, hemorrhagic. FINDINGS: BRAIN AND VENTRICLES: 3.3 cm left posterior corona radiata hemorrhage is again noted. No hydrocephalus is present. No underlying mass lesion or pathologic  enhancement is present. Periventricular and scattered subcortical T2 hyperintensities are moderately advanced for age. ORBITS: No acute abnormality. SINUSES: Mild mucosal thickening is present in the left frontal sinus, left greater than right ethmoid air cells and left maxillary sinus. No fluid levels are present. BONES AND SOFT TISSUES: Normal bone marrow signal and enhancement. No acute soft tissue abnormality. IMPRESSION: 1. 3.3 cm left posterior corona radiata hemorrhage, stable compared to prior CT head without contrast on 12/08/23. 2. No hydrocephalus, underlying mass lesion, or pathologic enhancement. 3. Moderately advanced periventricular and scattered subcortical T2 hyperintensities for age. 4. Mild mucosal thickening in the left frontal sinus, left greater than right ethmoid air cells, and left maxillary sinus without fluid levels. Electronically signed by: Lonni Necessary MD 12/09/2023 08:55 AM EDT RP Workstation: [id number]   CT ANGIO HEAD NECK W WO CM Result Date: 12/09/2023 CLINICAL DATA:  Initial evaluation for acute stroke. EXAM: CT ANGIOGRAPHY HEAD AND NECK WITH AND WITHOUT CONTRAST TECHNIQUE: Multidetector CT imaging of the head and neck was performed using the standard protocol during bolus administration of intravenous contrast. Multiplanar CT image reconstructions and MIPs were obtained to evaluate the vascular anatomy. Carotid stenosis measurements (when applicable) are obtained utilizing NASCET criteria, using the distal internal carotid diameter as the denominator. RADIATION DOSE REDUCTION: This exam was performed according to the departmental dose-optimization program which includes automated exposure control, adjustment of the mA and/or kV according to patient size and/or use  of iterative reconstruction technique. CONTRAST:  75mL OMNIPAQUE  IOHEXOL  350 MG/ML SOLN COMPARISON:  Comparison made with prior CT from earlier the same day. FINDINGS: CTA NECK FINDINGS Aortic arch: Visualized  aortic arch within normal limits for caliber with standard branch pattern. No stenosis about the origin the great vessels. Right carotid system: Right common and internal carotid arteries are patent without stenosis or dissection. Left carotid system: Left common and internal carotid arteries are patent without stenosis or dissection. Vertebral arteries: Left vertebral artery arises directly from the aortic arch. Vertebral arteries patent without stenosis or dissection. Skeleton: No discrete or worrisome osseous lesions. Other neck: No other acute finding. Upper chest: No other acute finding. Review of the MIP images confirms the above findings CTA HEAD FINDINGS Anterior circulation: Both internal carotid arteries widely patent to the termini without stenosis. A1 segments widely patent. Normal anterior communicating artery complex. Both anterior cerebral arteries widely patent to their distal aspects without stenosis. No M1 stenosis or occlusion. Normal MCA bifurcations. Distal MCA branches well perfused and symmetric. No significant beading. Posterior circulation: Both V4 segments patent without stenosis. Both PICA patent. Basilar patent without stenosis. Superior cerebellar and posterior cerebral arteries patent bilaterally. Venous sinuses: Patent allowing for timing the contrast bolus. Anatomic variants: None significant. No intracranial aneurysm. No vascular abnormality seen underlying the left thalamic hemorrhage. No spot sign. No abnormal enhancement on delayed images. Review of the MIP images confirms the above findings IMPRESSION: Negative CTA of the head and neck. No large vessel occlusion, hemodynamically significant stenosis, or other acute vascular abnormality. No vascular abnormality seen underlying the left thalamic hemorrhage. Electronically Signed   By: Morene Hoard M.D.   On: 12/09/2023 03:01   CT HEAD POST STROKE FOLLOWUP/TIMED/STAT READ Result Date: 12/08/2023 CLINICAL DATA:  Follow-up  examination for hemorrhagic stroke. EXAM: CT HEAD WITHOUT CONTRAST TECHNIQUE: Contiguous axial images were obtained from the base of the skull through the vertex without intravenous contrast. RADIATION DOSE REDUCTION: This exam was performed according to the departmental dose-optimization program which includes automated exposure control, adjustment of the mA and/or kV according to patient size and/or use of iterative reconstruction technique. COMPARISON:  CT from earlier the same day. FINDINGS: Brain: Previously identified acute hemorrhage involving the left thalamus again seen, not significantly changed in size and morphology as compared to previous. No significant surrounding edema or regional mass effect. Intraventricular extension with blood in the left lateral and third ventricles. Appearance is similar to prior. No hydrocephalus or ventricular trapping. No other new acute intracranial hemorrhage. No other acute large vessel territory infarct. No mass lesion or significant midline shift. No extra-axial fluid collection. Vascular: No abnormal hyperdense vessel. Skull: Scalp soft tissues and calvarium demonstrate no new finding. Sinuses/Orbits: Globes orbital soft tissues within normal limits. Scattered mucosal thickening noted throughout the paranasal sinuses. No significant mastoid effusion. Other: None. IMPRESSION: 1. No significant interval change in size and morphology of acute left thalamic hemorrhage with intraventricular extension. No hydrocephalus or ventricular trapping. 2. No other new acute intracranial abnormality. Electronically Signed   By: Morene Hoard M.D.   On: 12/08/2023 23:55   CT Head Wo Contrast Result Date: 12/08/2023 CLINICAL DATA:  Mental status change. Fatigue with confusion and headache. Elevated blood pressure in triage. EXAM: CT HEAD WITHOUT CONTRAST TECHNIQUE: Contiguous axial images were obtained from the base of the skull through the vertex without intravenous contrast.  RADIATION DOSE REDUCTION: This exam was performed according to the departmental dose-optimization program which includes automated exposure control,  adjustment of the mA and/or kV according to patient size and/or use of iterative reconstruction technique. COMPARISON:  None Available. FINDINGS: Brain: There is focus of acute hemorrhage over the superior aspect of the left thalamus and extending into the left lateral ventricle. This measures approximately 2.9 x 1.2 cm in AP and transverse dimension over the superior aspect of the left thalamus. There is mild local mass effect. No midline shift. Ventricles are otherwise normal and size. Remaining cisterns and CSF spaces are unremarkable. Vascular: No hyperdense vessel or unexpected calcification. Skull: Normal. Negative for fracture or focal lesion. Sinuses/Orbits: Mild chronic inflammatory change over the sinuses most notable over the left maxillary sinus. Visualized orbits are unremarkable. Mastoid air cells are clear. Other: None. IMPRESSION: 1. Acute hemorrhage over the superior aspect of the left thalamus and extending into the left lateral ventricle likely hypertensive in etiology. Critical Value/emergent results were called by telephone at the time of interpretation on 12/08/2023 at 4:09 pm to provider [facility] , who verbally acknowledged these results. Electronically Signed   By: Toribio Agreste M.D.   On: 12/08/2023 16:09   DG Chest Portable 1 View Result Date: 12/08/2023 EXAM: 1 VIEW XRAY OF THE CHEST 12/08/2023 03:24:00 PM COMPARISON: 03/09/2021 CLINICAL HISTORY: AMS. AMS AMS. AMS FINDINGS: LUNGS AND PLEURA: No focal pulmonary opacity. No pulmonary edema. No pleural effusion. No pneumothorax. HEART AND MEDIASTINUM: No acute abnormality of the cardiac and mediastinal silhouettes. BONES AND SOFT TISSUES: No acute osseous abnormality. IMPRESSION: 1. No acute process. Electronically signed by: Katheleen Faes MD 12/08/2023 03:37 PM EDT RP Workstation: [id number]     Vitals:   12/09/23 0615 12/09/23 0630 12/09/23 0645 12/09/23 0700  BP: 131/83 132/74 116/70 (!) 142/80  Pulse: 66 66 65 67  Resp: (!) 22 (!) 22 (!) 22 12  Temp:      TempSrc:      SpO2: 94% 94% 94% 97%     PHYSICAL EXAM General:  Alert, well-nourished, well-developed patient in no acute distress Psych:  Mood and affect appropriate for situation CV: Regular rate and rhythm on monitor Respiratory:  Regular, unlabored respirations on room air GI: Abdomen soft and nontender   NEURO:  Mental Status: AA&Ox3, patient is able to give clear and coherent history Speech/Language: speech is without dysarthria .has some word finding issues and aphasia.  Naming, repetition, fluency, and comprehension intact.  Cranial Nerves:  II: PERRL. Visual fields full.  III, IV, VI: EOMI. Eyelids elevate symmetrically.  V: Sensation is intact to light touch and symmetrical to face.  VII: Face is symmetrical resting and smiling VIII: hearing intact to voice. IX, X: Palate elevates symmetrically. Phonation is normal.  KP:Dynloizm shrug 5/5. XII: tongue is midline without fasciculations. Motor: 5/5 strength to all muscle groups tested.  Tone: is normal and bulk is normal Sensation- Intact to light touch bilaterally. Extinction absent to light touch to DSS.   Coordination: FTN intact bilaterally, HKS: no ataxia in BLE.No drift.  Gait- deferred  Most Recent NIH  1a Level of Conscious.:  1b LOC Questions:  1c LOC Commands:  2 Best Gaze:  3 Visual:  4 Facial Palsy:  5a Motor Arm - left:  5b Motor Arm - Right:  6a Motor Leg - Left:  6b Motor Leg - Right:  7 Limb Ataxia:  8 Sensory:  9 Best Language: 1 10 Dysarthria:  11 Extinct. and Inatten.:  TOTAL: 1   ASSESSMENT/PLAN  Charles Higgins is a 43 y.o. male with history of  HTN, DM, HLD, and CKD noncompliance with medications presenting with altered mental status and confusion. On arrival to the ED his blood pressure was 204/156.  Reports  some word finding difficulty.  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  ICH score 1  Intracerebral Hemorrhage and IVH:  left thalamus  Etiology: Hypertensive and uncontrolled risk factors CT head Acute hemorrhage over the superior aspect of the left thalamus and extending into the left lateral ventricle likely hypertensive in etiology.  Repeat CTH Stable  CTA head & neck No LVO  MRI  3.3 cm left posterior corona radiata hemorrhage,  2D Echo ordered LDL 98 HgbA1c 6.1 UDS negative VTE prophylaxis -SCDs No antithrombotic prior to admission, now on No antithrombotic for due to acute ICH  Therapy recommendations:  Pending Disposition: Pending  Hypertensive Emergency Home meds: Norvasc , Hyzaar and clonidine -noncompliant with medication UnStable On Cleviprex  drip at 18 mg Added home meds back Blood Pressure Goal: SBP between 130-150 for 24 hours and then less than 160   Hyperlipidemia Home meds: Crestor  5 mg, not resumed in hospital LDL 98 goal < 70 Consider increasing to 20 at discharge  Dysphagia Patient has post-stroke dysphagia, SLP consulted    Diet   Diet Heart Room service appropriate? Yes with Assist; Fluid consistency: Thin   advance diet as tolerated  Other Stroke Risk Factors ETOH use, , advised to drink no more than 2 drink(s) a day   Other Active Problems CKD  Hospital day # 1    Karna Geralds DNP, ACNPC-AG  [facility]  I have personally obtained history,examined this patient, reviewed notes, independently viewed imaging studies, participated in medical decision making and plan of care.ROS completed by me personally and pertinent positives fully documented  I have made any additions or clarifications directly to the above note. Agree with note above.  Patient is doing well.  Continue strict blood pressure control with systolic goal below 160.  Wean off Cleviprex  drip and use as  needed IV hydralazine  or labetalol  and increase p.o. medications gradually.  Mobilize out of bed.  Therapy consults.  Long discussion with patient and wife at the bedside and answered questions.This patient is critically ill and at significant risk of neurological worsening, death and care requires constant monitoring of vital signs, hemodynamics,respiratory and cardiac monitoring, extensive review of multiple databases, frequent neurological assessment, discussion with family, other specialists and medical decision making of high complexity.I have made any additions or clarifications directly to the above note.This critical care time does not reflect procedure time, or teaching time or supervisory time of PA/NP/Med Resident etc but could involve care discussion time.  I spent 30 minutes of neurocritical care time  in the care of  this patient.      Eather Popp, MD Medical Director [facility] Pager: [phone] 12/09/2023 5:43 PM Needs. To contact Stroke Continuity provider, please refer to [email or web address]. After hours, contact General Neurology

## 2023-12-10 DIAGNOSIS — I129 Hypertensive chronic kidney disease with stage 1 through stage 4 chronic kidney disease, or unspecified chronic kidney disease: Secondary | ICD-10-CM | POA: Diagnosis not present

## 2023-12-10 DIAGNOSIS — R29701 NIHSS score 1: Secondary | ICD-10-CM | POA: Diagnosis not present

## 2023-12-10 DIAGNOSIS — I615 Nontraumatic intracerebral hemorrhage, intraventricular: Secondary | ICD-10-CM | POA: Diagnosis not present

## 2023-12-10 DIAGNOSIS — I618 Other nontraumatic intracerebral hemorrhage: Secondary | ICD-10-CM | POA: Diagnosis not present

## 2023-12-10 LAB — CBC
HCT: 46.8 % (ref 39.0–52.0)
Hemoglobin: 15.8 g/dL (ref 13.0–17.0)
MCH: 27.1 pg (ref 26.0–34.0)
MCHC: 33.8 g/dL (ref 30.0–36.0)
MCV: 80.1 fL (ref 80.0–100.0)
Platelets: 231 K/uL (ref 150–400)
RBC: 5.84 MIL/uL — ABNORMAL HIGH (ref 4.22–5.81)
RDW: 13.4 % (ref 11.5–15.5)
WBC: 5.4 K/uL (ref 4.0–10.5)
nRBC: 0 % (ref 0.0–0.2)

## 2023-12-10 LAB — COMPREHENSIVE METABOLIC PANEL WITH GFR
ALT: 21 U/L (ref 0–44)
AST: 24 U/L (ref 15–41)
Albumin: 3.9 g/dL (ref 3.5–5.0)
Alkaline Phosphatase: 61 U/L (ref 38–126)
Anion gap: 12 (ref 5–15)
BUN: 11 mg/dL (ref 6–20)
CO2: 24 mmol/L (ref 22–32)
Calcium: 9 mg/dL (ref 8.9–10.3)
Chloride: 100 mmol/L (ref 98–111)
Creatinine, Ser: 1.54 mg/dL — ABNORMAL HIGH (ref 0.61–1.24)
GFR, Estimated: 57 mL/min — ABNORMAL LOW (ref >60)
Glucose, Bld: 132 mg/dL — ABNORMAL HIGH (ref 70–99)
Potassium: 3.2 mmol/L — ABNORMAL LOW (ref 3.5–5.1)
Sodium: 136 mmol/L (ref 135–145)
Total Bilirubin: 0.9 mg/dL (ref 0.0–1.2)
Total Protein: 7.4 g/dL (ref 6.5–8.1)

## 2023-12-10 LAB — LIPID PANEL
Cholesterol: 240 mg/dL — ABNORMAL HIGH (ref 0–200)
HDL: 38 mg/dL — ABNORMAL LOW (ref >40)
LDL Cholesterol: UNDETERMINED mg/dL (ref 0–99)
Total CHOL/HDL Ratio: 6.3 ratio
Triglycerides: 405 mg/dL — ABNORMAL HIGH (ref <150)
VLDL: UNDETERMINED mg/dL (ref 0–40)

## 2023-12-10 LAB — LDL CHOLESTEROL, DIRECT: Direct LDL: 183 mg/dL — ABNORMAL HIGH (ref 0–99)

## 2023-12-10 MED ORDER — ENOXAPARIN SODIUM 40 MG/0.4ML IJ SOSY
40.0000 mg | PREFILLED_SYRINGE | Freq: Every day | INTRAMUSCULAR | Status: DC
  Administered 2023-12-10 – 2023-12-13 (×5): 40 mg via SUBCUTANEOUS
  Filled 2023-12-10 (×4): qty 0.4

## 2023-12-10 MED ORDER — HYDRALAZINE HCL 50 MG PO TABS
50.0000 mg | ORAL_TABLET | Freq: Three times a day (TID) | ORAL | Status: DC
  Administered 2023-12-10: 50 mg via ORAL
  Filled 2023-12-10: qty 1

## 2023-12-10 MED ORDER — HYDRALAZINE HCL 50 MG PO TABS
50.0000 mg | ORAL_TABLET | Freq: Three times a day (TID) | ORAL | Status: DC
Start: 2023-12-10 — End: 2023-12-11
  Administered 2023-12-10 – 2023-12-11 (×4): 50 mg via ORAL
  Filled 2023-12-10 (×3): qty 1

## 2023-12-10 MED ORDER — POTASSIUM CHLORIDE 20 MEQ PO PACK
60.0000 meq | PACK | Freq: Once | ORAL | Status: AC
  Administered 2023-12-10: 60 meq via ORAL
  Filled 2023-12-10: qty 3

## 2023-12-10 MED ORDER — ONDANSETRON HCL 4 MG/2ML IJ SOLN
4.0000 mg | Freq: Four times a day (QID) | INTRAMUSCULAR | Status: DC | PRN
  Administered 2023-12-10 – 2023-12-12 (×3): 4 mg via INTRAVENOUS
  Filled 2023-12-10 (×3): qty 2

## 2023-12-10 MED ORDER — PROCHLORPERAZINE MALEATE 10 MG PO TABS
10.0000 mg | ORAL_TABLET | Freq: Four times a day (QID) | ORAL | Status: DC | PRN
  Administered 2023-12-10 – 2023-12-11 (×3): 10 mg via ORAL
  Filled 2023-12-10 (×4): qty 1

## 2023-12-10 NOTE — Plan of Care (Signed)
  Problem: Education: Goal: Knowledge of General Education information will improve Description: Including pain rating scale, medication(s)/side effects and non-pharmacologic comfort measures Outcome: Progressing   Problem: Elimination: Goal: Will not experience complications related to bowel motility Outcome: Progressing Goal: Will not experience complications related to urinary retention Outcome: Progressing   Problem: Safety: Goal: Ability to remain free from injury will improve Outcome: Progressing   Problem: Skin Integrity: Goal: Risk for impaired skin integrity will decrease Outcome: Progressing   Problem: Clinical Measurements: Goal: Ability to maintain clinical measurements within normal limits will improve Outcome: Not Progressing   Problem: Nutrition: Goal: Adequate nutrition will be maintained Outcome: Not Progressing

## 2023-12-10 NOTE — Progress Notes (Addendum)
 STROKE TEAM PROGRESS NOTE    SIGNIFICANT HOSPITAL EVENTS Patient presented with confusion and an elevated blood pressure of systolic BP 204/156.  CT head with ICH, noncompliance with medications  INTERIM HISTORY/SUBJECTIVE Family is at the bedside.  No new neurological events overnight. Patient remains on Cleviprex  drip at 7 mg.  Have added hydralazine  50 mg today to assist with getting off Cleviprex  drip Discussed with multiple family members at the bedside.  Neurological exam is unchanged.  Vital signs stable  CBC    Component Value Date/Time   WBC 5.4 12/10/2023 0827   RBC 5.84 (H) 12/10/2023 0827   HGB 15.8 12/10/2023 0827   HCT 46.8 12/10/2023 0827   PLT 231 12/10/2023 0827   MCV 80.1 12/10/2023 0827   MCH 27.1 12/10/2023 0827   MCHC 33.8 12/10/2023 0827   RDW 13.4 12/10/2023 0827   LYMPHSABS 1.5 12/08/2023 1506   MONOABS 0.4 12/08/2023 1506   EOSABS 0.1 12/08/2023 1506   BASOSABS 0.0 12/08/2023 1506    BMET    Component Value Date/Time   NA 136 12/10/2023 0827   NA 140 01/31/2019 0728   K 3.2 (L) 12/10/2023 0827   CL 100 12/10/2023 0827   CO2 24 12/10/2023 0827   GLUCOSE 132 (H) 12/10/2023 0827   BUN 11 12/10/2023 0827   BUN 16 01/31/2019 0728   CREATININE 1.54 (H) 12/10/2023 0827   CREATININE 1.33 (H) 03/22/2022 0917   CALCIUM  9.0 12/10/2023 0827   EGFR 69 03/22/2022 0917   GFRNONAA 57 (L) 12/10/2023 0827   GFRNONAA 62 11/08/2018 1308    IMAGING past 24 hours ECHOCARDIOGRAM COMPLETE Result Date: 12/09/2023    ECHOCARDIOGRAM REPORT   Patient Name:   Charles Higgins Date of Exam: 12/09/2023 Medical Rec #:  [id number]    Height:       68.0 in Accession #:    [id number]   Weight:       255.8 lb Date of Birth:  1980-08-03   BSA:          2.269 m Patient Age:    42 years     BP:           138/73 mmHg Patient Gender: M            HR:           72 bpm. Exam Location:  Inpatient Procedure: 2D Echo (Both Spectral and Color Flow Doppler were utilized during             procedure). Indications:    stroke  History:        Patient has no prior history of Echocardiogram examinations.                 Risk Factors:Diabetes, Hypertension and Dyslipidemia.  Referring Phys: [id number] ASHISH ARORA IMPRESSIONS  1. Left ventricular ejection fraction, by estimation, is 60 to 65%. The left ventricle has normal function. The left ventricle has no regional wall motion abnormalities. There is moderate left ventricular hypertrophy. Left ventricular diastolic parameters are indeterminate.  2. Right ventricular systolic function is normal. The right ventricular size is normal.  3. The mitral valve is normal in structure. No evidence of mitral valve regurgitation. No evidence of mitral stenosis.  4. The aortic valve is tricuspid. Aortic valve regurgitation is not visualized. No aortic stenosis is present.  5. Aortic dilatation noted. There is mild dilatation of the ascending aorta, measuring 37 mm.  6. The inferior vena cava is normal in size  with greater than 50% respiratory variability, suggesting right atrial pressure of 3 mmHg. FINDINGS  Left Ventricle: Left ventricular ejection fraction, by estimation, is 60 to 65%. The left ventricle has normal function. The left ventricle has no regional wall motion abnormalities. Strain was performed and the global longitudinal strain is indeterminate. The left ventricular internal cavity size was normal in size. There is moderate left ventricular hypertrophy. Left ventricular diastolic parameters are indeterminate. Right Ventricle: The right ventricular size is normal. Right vetricular wall thickness was not well visualized. Right ventricular systolic function is normal. Left Atrium: Left atrial size was normal in size. Right Atrium: Right atrial size was normal in size. Pericardium: There is no evidence of pericardial effusion. Mitral Valve: The mitral valve is normal in structure. No evidence of mitral valve regurgitation. No evidence of mitral valve  stenosis. Tricuspid Valve: The tricuspid valve is normal in structure. Tricuspid valve regurgitation is not demonstrated. No evidence of tricuspid stenosis. Aortic Valve: The aortic valve is tricuspid. Aortic valve regurgitation is not visualized. No aortic stenosis is present. Aortic valve mean gradient measures 7.4 mmHg. Aortic valve peak gradient measures 16.6 mmHg. Aortic valve area, by VTI measures 1.88  cm. Pulmonic Valve: The pulmonic valve was not well visualized. Pulmonic valve regurgitation is not visualized. No evidence of pulmonic stenosis. Aorta: The aortic root is normal in size and structure and aortic dilatation noted. There is mild dilatation of the ascending aorta, measuring 37 mm. Venous: The inferior vena cava is normal in size with greater than 50% respiratory variability, suggesting right atrial pressure of 3 mmHg. IAS/Shunts: No atrial level shunt detected by color flow Doppler.  LEFT VENTRICLE PLAX 2D LVIDd:         5.30 cm   Diastology LVIDs:         3.40 cm   LV e' medial:    6.53 cm/s LV PW:         1.50 cm   LV E/e' medial:  13.7 LV IVS:        1.40 cm   LV e' lateral:   7.94 cm/s LVOT diam:     2.00 cm   LV E/e' lateral: 11.2 LV SV:         67 LV SV Index:   29 LVOT Area:     3.14 cm  RIGHT VENTRICLE             IVC RV Basal diam:  3.00 cm     IVC diam: 1.60 cm RV S prime:     15.20 cm/s TAPSE (M-mode): 2.2 cm LEFT ATRIUM             Index        RIGHT ATRIUM           Index LA diam:        4.70 cm 2.07 cm/m   RA Area:     18.70 cm LA Vol (A2C):   78.1 ml 34.42 ml/m  RA Volume:   48.40 ml  21.33 ml/m LA Vol (A4C):   85.3 ml 37.59 ml/m LA Biplane Vol: 83.7 ml 36.89 ml/m  AORTIC VALVE AV Area (Vmax):    1.99 cm AV Area (Vmean):   2.00 cm AV Area (VTI):     1.88 cm AV Vmax:           203.61 cm/s AV Vmean:          124.024 cm/s AV VTI:  0.354 m AV Peak Grad:      16.6 mmHg AV Mean Grad:      7.4 mmHg LVOT Vmax:         129.00 cm/s LVOT Vmean:        78.900 cm/s LVOT VTI:           0.212 m LVOT/AV VTI ratio: 0.60  AORTA Ao Root diam: 3.30 cm Ao Asc diam:  3.70 cm MITRAL VALVE MV Area (PHT): 2.99 cm    SHUNTS MV Decel Time: 254 msec    Systemic VTI:  0.21 m MV E velocity: 89.30 cm/s  Systemic Diam: 2.00 cm MV A velocity: 42.60 cm/s MV E/A ratio:  2.10 Dorn Ross MD Electronically signed by Dorn Ross MD Signature Date/Time: 12/09/2023/3:10:24 PM    Final     Vitals:   12/10/23 1136 12/10/23 1145 12/10/23 1200 12/10/23 1215  BP:  133/84 (!) 143/93 134/84  Pulse:  74 63 68  Resp:  15 20 20   Temp:      TempSrc:      SpO2:  92% 92% 91%  Height: 5' 8 (1.727 m)        PHYSICAL EXAM General:  Alert, mildly obese middle-aged African-American male in no acute distress Psych:  Mood and affect appropriate for situation CV: Regular rate and rhythm on monitor Respiratory:  Regular, unlabored respirations on room air GI: Abdomen soft and nontender   NEURO:  Mental Status: AA&Ox3, patient is able to give clear and coherent history Speech/Language: speech is without dysarthria .has some word finding issues and aphasia.  Naming, repetition, fluency, and comprehension intact.  Cranial Nerves:  II: PERRL. Visual fields full.  III, IV, VI: EOMI. Eyelids elevate symmetrically.  V: Sensation is intact to light touch and symmetrical to face.  VII: Face is symmetrical resting and smiling VIII: hearing intact to voice. IX, X: Palate elevates symmetrically. Phonation is normal.  KP:Dynloizm shrug 5/5. XII: tongue is midline without fasciculations. Motor: 5/5 strength to all muscle groups tested.  Tone: is normal and bulk is normal Sensation- Intact to light touch bilaterally. Extinction absent to light touch to DSS.   Coordination: FTN intact bilaterally, HKS: no ataxia in BLE.No drift.  Gait- deferred  Most Recent NIH  1a Level of Conscious.:  1b LOC Questions:  1c LOC Commands:  2 Best Gaze:  3 Visual:  4 Facial Palsy:  5a Motor Arm - left:  5b Motor  Arm - Right:  6a Motor Leg - Left:  6b Motor Leg - Right:  7 Limb Ataxia:  8 Sensory:  9 Best Language: 1 10 Dysarthria:  11 Extinct. and Inatten.:  TOTAL: 1   ASSESSMENT/PLAN  Mr. Charles Higgins is a 43 y.o. male with history of HTN, DM, HLD, and CKD noncompliance with medications presenting with altered mental status and confusion. On arrival to the ED his blood pressure was 204/156.  Reports some word finding difficulty.  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  ICH score 1  Intracerebral Hemorrhage and IVH:  left thalamus  Etiology: Hypertensive and uncontrolled risk factors CT head Acute hemorrhage over the superior aspect of the left thalamus and extending into the left lateral ventricle likely hypertensive in etiology.  Repeat CTH Stable  CTA head & neck No LVO  MRI  3.3 cm left posterior corona radiata hemorrhage,  2D Echo EF 60 to 65%.  Moderate LVH LDL 98 HgbA1c 6.1 UDS negative VTE prophylaxis -  SCDs No antithrombotic prior to admission, now on No antithrombotic for due to acute ICH  Therapy recommendations:  Pending Disposition: Pending  Hypertensive Emergency Home meds: Norvasc , Hyzaar and clonidine -noncompliant with medication UnStable On Cleviprex  drip at mg, triglycerides 405 Continue home meds back, changed clonidine  to Avapro  300 mg Added hydralazine  50 mg every 8 hours Blood Pressure Goal: SBP less than 160   Hyperlipidemia Home meds: Crestor  5 mg, not resumed in hospital LDL 98 goal < 70 Consider increasing to 20 at discharge  Dysphagia Patient has post-stroke dysphagia, SLP consulted    Diet   Diet Heart Room service appropriate? Yes with Assist; Fluid consistency: Thin   advance diet as tolerated  Hypokalemia K3.2 will replace Repeat in the morning  Other Stroke Risk Factors ETOH use, , advised to drink no more than 2 drink(s) a day   Other Active  Problems CKD-creatinine 1.54  Hospital day # 2   Karna Geralds DNP, ACNPC-AG  [facility]  I have personally obtained history,examined this patient, reviewed notes, independently viewed imaging studies, participated in medical decision making and plan of care.ROS completed by me personally and pertinent positives fully documented  I have made any additions or clarifications directly to the above note. Agree with note above.  Patient neurological exam is unchanged however blood pressure is still difficult to control still requiring Cleviprex  drip.  Plan to add hydralazine  and continue to wean Cleviprex  drip as tolerated to keep systolic blood pressure below 839.  Continue mobilization out of bed.  Long discussion with patient and family at the bedside and answered questions. This patient is critically ill and at significant risk of neurological worsening, death and care requires constant monitoring of vital signs, hemodynamics,respiratory and cardiac monitoring, extensive review of multiple databases, frequent neurological assessment, discussion with family, other specialists and medical decision making of high complexity.I have made any additions or clarifications directly to the above note.This critical care time does not reflect procedure time, or teaching time or supervisory time of PA/NP/Med Resident etc but could involve care discussion time.  I spent 30 minutes of neurocritical care time  in the care of  this patient.     Eather Popp, MD Medical Director [facility] Pager: [phone] 12/10/2023 5:13 PM  To contact Stroke Continuity provider, please refer to [email or web address]. After hours, contact General Neurology

## 2023-12-10 NOTE — Progress Notes (Signed)
 Piv attempt x2 to left fore arm , unsuccessful. Pressure dressing applied to sites.

## 2023-12-11 DIAGNOSIS — I615 Nontraumatic intracerebral hemorrhage, intraventricular: Secondary | ICD-10-CM | POA: Diagnosis not present

## 2023-12-11 DIAGNOSIS — R29701 NIHSS score 1: Secondary | ICD-10-CM

## 2023-12-11 DIAGNOSIS — I129 Hypertensive chronic kidney disease with stage 1 through stage 4 chronic kidney disease, or unspecified chronic kidney disease: Secondary | ICD-10-CM | POA: Diagnosis not present

## 2023-12-11 DIAGNOSIS — I618 Other nontraumatic intracerebral hemorrhage: Secondary | ICD-10-CM | POA: Diagnosis not present

## 2023-12-11 DIAGNOSIS — Z91148 Patient's other noncompliance with medication regimen for other reason: Secondary | ICD-10-CM

## 2023-12-11 LAB — CBC
HCT: 48.2 % (ref 39.0–52.0)
Hemoglobin: 15.7 g/dL (ref 13.0–17.0)
MCH: 26.3 pg (ref 26.0–34.0)
MCHC: 32.6 g/dL (ref 30.0–36.0)
MCV: 80.7 fL (ref 80.0–100.0)
Platelets: 228 K/uL (ref 150–400)
RBC: 5.97 MIL/uL — ABNORMAL HIGH (ref 4.22–5.81)
RDW: 13.5 % (ref 11.5–15.5)
WBC: 8.1 K/uL (ref 4.0–10.5)
nRBC: 0 % (ref 0.0–0.2)

## 2023-12-11 LAB — HEMOGLOBIN A1C
Hgb A1c MFr Bld: 5.1 % (ref 4.8–5.6)
Mean Plasma Glucose: 100 mg/dL

## 2023-12-11 LAB — BASIC METABOLIC PANEL WITH GFR
Anion gap: 16 — ABNORMAL HIGH (ref 5–15)
BUN: 18 mg/dL (ref 6–20)
CO2: 23 mmol/L (ref 22–32)
Calcium: 8.9 mg/dL (ref 8.9–10.3)
Chloride: 98 mmol/L (ref 98–111)
Creatinine, Ser: 1.96 mg/dL — ABNORMAL HIGH (ref 0.61–1.24)
GFR, Estimated: 43 mL/min — ABNORMAL LOW (ref >60)
Glucose, Bld: 108 mg/dL — ABNORMAL HIGH (ref 70–99)
Potassium: 3.2 mmol/L — ABNORMAL LOW (ref 3.5–5.1)
Sodium: 137 mmol/L (ref 135–145)

## 2023-12-11 MED ORDER — HYDRALAZINE HCL 50 MG PO TABS
75.0000 mg | ORAL_TABLET | Freq: Three times a day (TID) | ORAL | Status: DC
  Administered 2023-12-11 – 2023-12-13 (×10): 75 mg via ORAL
  Filled 2023-12-11 (×6): qty 1

## 2023-12-11 MED ORDER — LABETALOL HCL 5 MG/ML IV SOLN
20.0000 mg | INTRAVENOUS | Status: DC | PRN
  Administered 2023-12-11 (×6): 20 mg via INTRAVENOUS
  Filled 2023-12-11: qty 4

## 2023-12-11 MED ORDER — HYDRALAZINE HCL 20 MG/ML IJ SOLN
10.0000 mg | INTRAMUSCULAR | Status: DC | PRN
  Administered 2023-12-11 – 2023-12-13 (×8): 20 mg via INTRAVENOUS
  Filled 2023-12-11 (×4): qty 1

## 2023-12-11 MED ORDER — POTASSIUM CHLORIDE CRYS ER 20 MEQ PO TBCR
60.0000 meq | EXTENDED_RELEASE_TABLET | Freq: Once | ORAL | Status: AC
  Administered 2023-12-11 (×2): 60 meq via ORAL
  Filled 2023-12-11: qty 3

## 2023-12-11 MED ORDER — LABETALOL HCL 5 MG/ML IV SOLN
40.0000 mg | INTRAVENOUS | Status: DC | PRN
  Administered 2023-12-12 – 2023-12-13 (×8): 40 mg via INTRAVENOUS
  Filled 2023-12-11 (×6): qty 8

## 2023-12-11 NOTE — Progress Notes (Addendum)
 STROKE TEAM PROGRESS NOTE    SIGNIFICANT HOSPITAL EVENTS Patient presented with confusion and an elevated blood pressure of systolic BP 204/156.  CT head with ICH, history of noncompliance with medications  INTERIM HISTORY/SUBJECTIVE Still complaining of mild headache.  Tylenol  with codeine  helps.  No neurological changes. Continues on Cleviprex , weaning as tolerated, now at 72ml/hr.  Labetalol  PRN added and Hydralazine  increased to 75mg   CBC    Component Value Date/Time   WBC 5.4 12/10/2023 0827   RBC 5.84 (H) 12/10/2023 0827   HGB 15.8 12/10/2023 0827   HCT 46.8 12/10/2023 0827   PLT 231 12/10/2023 0827   MCV 80.1 12/10/2023 0827   MCH 27.1 12/10/2023 0827   MCHC 33.8 12/10/2023 0827   RDW 13.4 12/10/2023 0827   LYMPHSABS 1.5 12/08/2023 1506   MONOABS 0.4 12/08/2023 1506   EOSABS 0.1 12/08/2023 1506   BASOSABS 0.0 12/08/2023 1506    BMET    Component Value Date/Time   NA 136 12/10/2023 0827   NA 140 01/31/2019 0728   K 3.2 (L) 12/10/2023 0827   CL 100 12/10/2023 0827   CO2 24 12/10/2023 0827   GLUCOSE 132 (H) 12/10/2023 0827   BUN 11 12/10/2023 0827   BUN 16 01/31/2019 0728   CREATININE 1.54 (H) 12/10/2023 0827   CREATININE 1.33 (H) 03/22/2022 0917   CALCIUM  9.0 12/10/2023 0827   EGFR 69 03/22/2022 0917   GFRNONAA 57 (L) 12/10/2023 0827   GFRNONAA 62 11/08/2018 1308    IMAGING past 24 hours No results found.   Vitals:   12/11/23 0815 12/11/23 0830 12/11/23 0845 12/11/23 0850  BP: (!) 154/101   (!) 148/85  Pulse: 69 78 71 74  Resp: (!) 23 20 (!) 23 13  Temp:      TempSrc:      SpO2: 92% 97% 92% 93%  Height:         PHYSICAL EXAM General:  Alert, mildly obese middle-aged African-American male in no acute distress Psych:  Mood and affect appropriate for situation CV: Regular rate and rhythm on monitor Respiratory:  Regular, unlabored respirations on room air GI: Abdomen soft and nontender   NEURO:  Mental Status: AA&Ox3, patient is able to give  clear and coherent history Speech/Language: Speech is without dysarthria, but continues to have word finding issues and aphasia. Impaired memory and recall.   Cranial Nerves:  II: PERRL. Visual fields full.  III, IV, VI: EOMI. Eyelids elevate symmetrically.  V: Sensation is intact to light touch and symmetrical to face.  VII: Face is symmetrical resting and smiling VIII: hearing intact to voice. IX, X: Palate elevates symmetrically. Phonation is normal.  KP:Dynloizm shrug 5/5. XII: tongue is midline without fasciculations. Motor: 5/5 strength to all muscle groups tested.  Tone: is normal and bulk is normal Sensation- Intact to light touch bilaterally. Extinction absent to light touch to DSS.   Coordination: FTN intact bilaterally, HKS: no ataxia in BLE.No drift.  Gait- deferred  Most Recent NIH 1   ASSESSMENT/PLAN  Charles Higgins is a 43 y.o. male with history of HTN, DM, HLD, and CKD noncompliance with medications presenting with altered mental status and confusion. On arrival to the ED his blood pressure was 204/156.  Reports some word finding difficulty.  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  ICH score 1  Intracerebral Hemorrhage and IVH:  left thalamus  Etiology: Hypertensive and uncontrolled risk factors CT head Acute hemorrhage over the  superior aspect of the left thalamus and extending into the left lateral ventricle likely hypertensive in etiology.  Repeat CTH Stable  CTA head & neck No LVO  MRI  3.3 cm left posterior corona radiata hemorrhage  2D Echo EF 60 to 65%.  Moderate LVH LDL 98 HgbA1c 6.1 UDS negative VTE prophylaxis -SCDs No antithrombotic prior to admission, continue No antithrombotic due to acute ICH  Therapy recommendations:  No PT follow-up, possibly outpatient cognitive rehab Disposition: Pending, likely home  Hypertensive Emergency Home meds: Norvasc , Hyzaar and  clonidine -noncompliant with medication UnStable On Cleviprex  drip at mg, triglycerides 405--wean as tolerated Labetalol  IV PRN 20mg  Q2H added, can increase to 40mg  if needed Continue home meds back, changed clonidine  to Avapro  300 mg Continue hydralazine  50 mg--> 75mg  every 8 hours Blood Pressure Goal: SBP less than 160   Hyperlipidemia Home meds: Crestor  5 mg, not resumed in hospital LDL 98 goal < 70 Consider increasing to 20 at discharge  Hypokalemia K3.2, will replace BMP 8/12 AM   Other Stroke Risk Factors ETOH use, advised to drink no more than 2 drink(s) a day  Other Active Problems CKD-creatinine 1.54  Hospital day # 3   Pt seen by Neuro NP/APP and later by MD. Note/plan to be edited by MD as needed.    Charles JAYSON Likes, DNP, AGACNP-BC [facility] Please use AMION for contact information & EPIC for messaging. I have personally obtained history,examined this patient, reviewed notes, independently viewed imaging studies, participated in medical decision making and plan of care.ROS completed by me personally and pertinent positives fully documented  I have made any additions or clarifications directly to the above note. Agree with note above.  Continue Tylenol  No. 3 2 tablets every 6 hourly for headaches.  Wean off Cleviprex  drip.  Increase p.o. blood pressure medications.  Increased as needed IV labetalol  to 20 to 40 mg every 2 hourly as needed.  Long discussion with patient and family at the bedside and answered questions. This patient is critically ill and at significant risk of neurological worsening, death and care requires constant monitoring of vital signs, hemodynamics,respiratory and cardiac monitoring, extensive review of multiple databases, frequent neurological assessment, discussion with family, other specialists and medical decision making of high complexity.I have made any additions or clarifications directly to the above note.This critical care time does  not reflect procedure time, or teaching time or supervisory time of PA/NP/Med Resident etc but could involve care discussion time.  I spent 30 minutes of neurocritical care time  in the care of  this patient.     Eather Popp, MD Medical Director [facility] Pager: [phone] 12/11/2023 3:02 PM

## 2023-12-11 NOTE — TOC CM/SW Note (Signed)
 Transition of Care [facility]) - Inpatient Brief Assessment   Patient Details  Name: Charles Higgins MRN: [id number] Date of Birth: [birth year]  Transition [facility] CM/SW Contact:    Inocente GORMAN Kindle, LCSW Phone Number: 12/11/2023, 4:59 PM   Clinical Narrative: Patient admitted from home with spouse undergoing ICH workup. ICm following for PCP consult needs.    Transition of Care Asessment: Insurance and Status: Insurance coverage has been reviewed Patient has primary care physician: No Home environment has been reviewed: From home Prior level of function:: Independent Prior/Current Home Services: No current home services Social Drivers of Health Review: SDOH reviewed no interventions necessary Readmission risk has been reviewed: Yes Transition of care needs: transition of care needs identified, TOC will continue to follow

## 2023-12-12 ENCOUNTER — Inpatient Hospital Stay (HOSPITAL_COMMUNITY)

## 2023-12-12 ENCOUNTER — Encounter (HOSPITAL_COMMUNITY): Payer: Self-pay | Admitting: Student in an Organized Health Care Education/Training Program

## 2023-12-12 DIAGNOSIS — I615 Nontraumatic intracerebral hemorrhage, intraventricular: Secondary | ICD-10-CM | POA: Diagnosis not present

## 2023-12-12 DIAGNOSIS — I618 Other nontraumatic intracerebral hemorrhage: Secondary | ICD-10-CM | POA: Diagnosis not present

## 2023-12-12 DIAGNOSIS — R29701 NIHSS score 1: Secondary | ICD-10-CM | POA: Diagnosis not present

## 2023-12-12 DIAGNOSIS — I129 Hypertensive chronic kidney disease with stage 1 through stage 4 chronic kidney disease, or unspecified chronic kidney disease: Secondary | ICD-10-CM | POA: Diagnosis not present

## 2023-12-12 LAB — BASIC METABOLIC PANEL WITH GFR
Anion gap: 12 (ref 5–15)
BUN: 23 mg/dL — ABNORMAL HIGH (ref 6–20)
CO2: 27 mmol/L (ref 22–32)
Calcium: 9.2 mg/dL (ref 8.9–10.3)
Chloride: 98 mmol/L (ref 98–111)
Creatinine, Ser: 1.83 mg/dL — ABNORMAL HIGH (ref 0.61–1.24)
GFR, Estimated: 46 mL/min — ABNORMAL LOW (ref >60)
Glucose, Bld: 118 mg/dL — ABNORMAL HIGH (ref 70–99)
Potassium: 3.2 mmol/L — ABNORMAL LOW (ref 3.5–5.1)
Sodium: 137 mmol/L (ref 135–145)

## 2023-12-12 LAB — PHOSPHORUS: Phosphorus: 4.6 mg/dL (ref 2.5–4.6)

## 2023-12-12 LAB — MAGNESIUM: Magnesium: 2.3 mg/dL (ref 1.7–2.4)

## 2023-12-12 MED ORDER — ATORVASTATIN CALCIUM 40 MG PO TABS
40.0000 mg | ORAL_TABLET | Freq: Every day | ORAL | Status: DC
  Administered 2023-12-12 – 2023-12-13 (×4): 40 mg via ORAL
  Filled 2023-12-12 (×2): qty 1

## 2023-12-12 MED ORDER — POTASSIUM CHLORIDE 20 MEQ PO PACK
40.0000 meq | PACK | Freq: Once | ORAL | Status: AC
  Administered 2023-12-12 (×2): 20 meq via ORAL
  Filled 2023-12-12: qty 2

## 2023-12-12 MED ORDER — AMLODIPINE BESYLATE 10 MG PO TABS: 10.0000 mg | ORAL_TABLET | Freq: Every day | ORAL | 0 refills | Status: AC

## 2023-12-12 MED ORDER — CLONIDINE HCL 0.1 MG PO TABS: 0.1000 mg | ORAL_TABLET | Freq: Two times a day (BID) | ORAL | 0 refills | Status: AC

## 2023-12-12 MED ORDER — HYDROMORPHONE HCL 1 MG/ML IJ SOLN
0.5000 mg | Freq: Once | INTRAMUSCULAR | Status: AC
  Administered 2023-12-12 (×2): 0.5 mg via INTRAVENOUS
  Filled 2023-12-12: qty 1

## 2023-12-12 MED ORDER — LOSARTAN POTASSIUM-HCTZ 100-25 MG PO TABS: 1.0000 | ORAL_TABLET | Freq: Every day | ORAL | 0 refills | Status: AC

## 2023-12-12 MED ORDER — POTASSIUM CHLORIDE 20 MEQ PO PACK
40.0000 meq | PACK | Freq: Once | ORAL | Status: AC
  Administered 2023-12-12 (×2): 40 meq via ORAL

## 2023-12-12 MED ORDER — ONDANSETRON 4 MG PO TBDP: 4.0000 mg | ORAL_TABLET | Freq: Three times a day (TID) | ORAL | 0 refills | Status: AC | PRN

## 2023-12-12 MED ORDER — ATORVASTATIN CALCIUM 40 MG PO TABS: 40.0000 mg | ORAL_TABLET | Freq: Every day | ORAL | 0 refills | Status: AC

## 2023-12-12 MED ORDER — ONDANSETRON 4 MG PO TBDP: 4.0000 mg | ORAL_TABLET | Freq: Three times a day (TID) | ORAL | Status: DC | PRN

## 2023-12-12 MED ORDER — POTASSIUM CHLORIDE 20 MEQ PO PACK
60.0000 meq | PACK | Freq: Once | ORAL | Status: DC
  Filled 2023-12-12: qty 3

## 2023-12-12 NOTE — Progress Notes (Signed)
 Occupational Therapy Treatment Patient Details Name: Charles Higgins MRN: [id number] DOB: 07-10-1980 Today's Date: 12/12/2023   History of present illness 43 y.o. male presenting 12/08/23 with AMS and word finding difficulty. MRI: revealed acute left thalamic hemorrhage with intraventricular extension. PMH: HTN, DM, HLD, and CKD   OT comments  Pt progressing toward goals this session, noted to still have impaired memory, with difficulty recalling address in SBT. Pt needs min cues for problem solving during trail making task B and incr time, but still scoring WNL. Pt presenting with impairments listed below, will follow acutely. Recommend OP OT at d/c.   BP pre mobility 141/96 (107) BP post mobility in chair 150/128 (137) BP end of session seated 149/87 (105) RN notified.      If plan is discharge home, recommend the following:  Assist for transportation;Direct supervision/assist for financial management   Equipment Recommendations  None recommended by OT    Recommendations for Other Services      Precautions / Restrictions Precautions Precautions: Other (comment) Precaution/Restrictions Comments: SBP < 150 goal Restrictions Weight Bearing Restrictions Per Provider Order: No       Mobility Bed Mobility Overal bed mobility: Modified Independent                  Transfers Overall transfer level: Modified independent Equipment used: None Transfers: Sit to/from Stand Sit to Stand: Modified independent (Device/Increase time)                 Balance Overall balance assessment: No apparent balance deficits (not formally assessed)                                         ADL either performed or assessed with clinical judgement   ADL Overall ADL's : Needs assistance/impaired                 Upper Body Dressing : Supervision/safety;Sitting       Toilet Transfer: Supervision/safety;Ambulation           Functional mobility during ADLs:  Supervision/safety      Extremity/Trunk Assessment Upper Extremity Assessment Upper Extremity Assessment: Overall WFL for tasks assessed   Lower Extremity Assessment Lower Extremity Assessment: Defer to PT evaluation        Vision       Perception Perception Perception: Within Functional Limits   Praxis Praxis Praxis: [facility]   Communication Communication Communication: Impaired Factors Affecting Communication: Difficulty expressing self   Cognition Arousal: Alert Behavior During Therapy: WFL for tasks assessed/performed Cognition: Cognition impaired, [place] level       Memory impairment (select all impairments): Short-term memory, Working memory Attention impairment (select first level of impairment): Selective attention Executive functioning impairment (select all impairments): Problem solving, Sequencing OT - Cognition Comments: pt with 5 errors on SBT, however performs trailmaking test A & B within normal limits                 Following commands: Intact        Cueing   Cueing Techniques: Verbal cues  Exercises      Shoulder Instructions       General Comments see note for BP measures    Pertinent Vitals/ Pain       Pain Assessment Pain Assessment: Faces Pain Score: 4  Faces Pain Scale: Hurts little more Pain Location: head/headache Pain Descriptors / Indicators: Discomfort  Home  Living                                          Prior Functioning/Environment              Frequency  Min 2X/week        Progress Toward Goals  OT Goals(current goals can now be found in the care plan section)  Progress towards OT goals: Progressing toward goals  Acute Rehab OT Goals Patient Stated Goal: none stated OT Goal Formulation: With patient Time For Goal Achievement: 12/22/23 Potential to Achieve Goals: Good ADL Goals Pt Will Perform Lower Body Dressing: Independently;sit to/from stand Pt Will Transfer to Toilet:  Independently;ambulating;regular height toilet Additional ADL Goal #1: Patient will recall 2 to 3 items relating to prior OT session. Additional ADL Goal #2: Patient will follow multi step task with min VC's  Plan      Co-evaluation                 AM-PAC OT 6 Clicks Daily Activity     Outcome Measure   Help from another person eating meals?: None Help from another person taking care of personal grooming?: None Help from another person toileting, which includes using toliet, bedpan, or urinal?: A Little Help from another person bathing (including washing, rinsing, drying)?: A Little Help from another person to put on and taking off regular upper body clothing?: None Help from another person to put on and taking off regular lower body clothing?: A Little 6 Click Score: 21    End of Session Equipment Utilized During Treatment: Gait belt  OT Visit Diagnosis: Other symptoms and signs involving cognitive function   Activity Tolerance Patient tolerated treatment well   Patient Left in chair;with call bell/phone within reach;with family/visitor present;with nursing/sitter in room   Nurse Communication Mobility status (BP)        Time: 9182-9159 OT Time Calculation (min): 23 min  Charges: OT General Charges $OT Visit: 1 Visit OT Treatments $Therapeutic Activity: 8-22 mins $Cognitive Funtion inital: Initial 15 mins  Amilya Haver K, OTD, OTR/L SecureChat Preferred Acute Rehab [phone]   Preston Garabedian K Koonce 12/12/2023, 11:07 AM

## 2023-12-12 NOTE — Progress Notes (Signed)
 STROKE TEAM PROGRESS NOTE    SIGNIFICANT HOSPITAL EVENTS Patient presented with confusion and an elevated blood pressure of systolic BP 204/156.  CT head with ICH, history of noncompliance with medications  INTERIM HISTORY/SUBJECTIVE Patient is sitting up in bed.  He seems to be doing fine.  He has been able to ambulate in therapies recommend home therapies only.  Still complains of some nausea..  No neurological changes. He has now been off Cleviprex  drip since yesterday evening.  Blood pressure adequately controlled with as needed and oral medications.  CBC    Component Value Date/Time   WBC 8.1 12/11/2023 0832   RBC 5.97 (H) 12/11/2023 0832   HGB 15.7 12/11/2023 0832   HCT 48.2 12/11/2023 0832   PLT 228 12/11/2023 0832   MCV 80.7 12/11/2023 0832   MCH 26.3 12/11/2023 0832   MCHC 32.6 12/11/2023 0832   RDW 13.5 12/11/2023 0832   LYMPHSABS 1.5 12/08/2023 1506   MONOABS 0.4 12/08/2023 1506   EOSABS 0.1 12/08/2023 1506   BASOSABS 0.0 12/08/2023 1506    BMET    Component Value Date/Time   NA 137 12/12/2023 0639   NA 140 01/31/2019 0728   K 3.2 (L) 12/12/2023 0639   CL 98 12/12/2023 0639   CO2 27 12/12/2023 0639   GLUCOSE 118 (H) 12/12/2023 0639   BUN 23 (H) 12/12/2023 0639   BUN 16 01/31/2019 0728   CREATININE 1.83 (H) 12/12/2023 0639   CREATININE 1.33 (H) 03/22/2022 0917   CALCIUM  9.2 12/12/2023 0639   EGFR 69 03/22/2022 0917   GFRNONAA 46 (L) 12/12/2023 0639   GFRNONAA 62 11/08/2018 1308    IMAGING past 24 hours CT HEAD WO CONTRAST ( ) Result Date: 12/12/2023 EXAM: CT HEAD WITHOUT CONTRAST 12/12/2023 02:17:10 PM TECHNIQUE: CT of the head was performed without the administration of intravenous contrast. Automated exposure control, iterative reconstruction, and/or weight based adjustment of the mA/kV was utilized to reduce the radiation dose to as low as reasonably achievable. COMPARISON: CT of the head dated 12/09/2023. CLINICAL HISTORY: Stroke, hemorrhagic.  FINDINGS: BRAIN AND VENTRICLES: There has been interval improvement of intraventricular hemorrhage within the left lateral ventricle. The hemorrhage within the left thalamus is unchanged in the interim. There are mild edematous changes along the lateral margin of the hemorrhage but there is no significant cerebral swelling or shift to the midline structures. There is no evidence of interval hemorrhage. ORBITS: The orbits are unremarkable. SINUSES: There is mucosal disease within the ethmoid and maxillary sinuses. SOFT TISSUES AND SKULL: No acute soft tissue abnormality. No skull fracture. IMPRESSION: 1. Interval improvement of intraventricular hemorrhage within the left lateral ventricle. 2. Unchanged hemorrhage within the left thalamus with mild edematous changes along the lateral margin. No significant cerebral swelling or midline shift. 3. No evidence of interval hemorrhage. Electronically signed by: evalene coho 12/12/2023 02:36 PM EDT RP Workstation: [id number]     Vitals:   12/12/23 1230 12/12/23 1330 12/12/23 1400 12/12/23 1430  BP: (!) 154/101 (!) 161/109 (!) 167/110 (!) 172/114  Pulse: 75 76 72 76  Resp: 18 20 (!) 22 (!) 25  Temp:      TempSrc:      SpO2: 95% 97% 95% 95%  Weight:      Height:         PHYSICAL EXAM General:  Alert, mildly obese middle-aged African-American male in no acute distress Psych:  Mood and affect appropriate for situation CV: Regular rate and rhythm on monitor Respiratory:  Regular, unlabored respirations  on room air GI: Abdomen soft and nontender   NEURO:  Mental Status: AA&Ox3, patient is able to give clear and coherent history Speech/Language: Speech is without dysarthria, but continues to have word finding issues and aphasia. Impaired memory and recall.   Cranial Nerves:  II: PERRL. Visual fields full.  III, IV, VI: EOMI. Eyelids elevate symmetrically.  V: Sensation is intact to light touch and symmetrical to face.  VII: Face is symmetrical  resting and smiling VIII: hearing intact to voice. IX, X: Palate elevates symmetrically. Phonation is normal.  KP:Dynloizm shrug 5/5. XII: tongue is midline without fasciculations. Motor: 5/5 strength to all muscle groups tested.  Tone: is normal and bulk is normal Sensation- Intact to light touch bilaterally. Extinction absent to light touch to DSS.   Coordination: FTN intact bilaterally, HKS: no ataxia in BLE.No drift.  Gait- deferred  Most Recent NIH 1   ASSESSMENT/PLAN  Charles Higgins is a 43 y.o. male with history of HTN, DM, HLD, and CKD noncompliance with medications presenting with altered mental status and confusion. On arrival to the ED his blood pressure was 204/156.  Reports some word finding difficulty.  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  ICH score 1  Intracerebral Hemorrhage and IVH:  left thalamus  Etiology: Hypertensive and uncontrolled risk factors CT head Acute hemorrhage over the superior aspect of the left thalamus and extending into the left lateral ventricle likely hypertensive in etiology.  Repeat CTH Stable  CTA head & neck No LVO  MRI  3.3 cm left posterior corona radiata hemorrhage  2D Echo EF 60 to 65%.  Moderate LVH LDL 98 HgbA1c 6.1 UDS negative VTE prophylaxis -SCDs No antithrombotic prior to admission, continue No antithrombotic due to acute ICH  Therapy recommendations:  No PT follow-up, possibly outpatient cognitive rehab Disposition: Pending, likely home  Hypertensive Emergency Home meds: Norvasc , Hyzaar and clonidine -noncompliant with medication UnStable On Cleviprex  drip at mg, triglycerides 405--wean as tolerated Labetalol  IV PRN 20mg  Q2H added, can increase to 40mg  if needed Continue home meds back, changed clonidine  to Avapro  300 mg Continue hydralazine  50 mg--> 75mg  every 8 hours Blood Pressure Goal: SBP less than 160   Hyperlipidemia Home meds: Crestor  5  mg, not resumed in hospital LDL 98 goal < 70 Consider increasing to 20 at discharge  Hypokalemia K3.2, will replace BMP 8/12 AM   Other Stroke Risk Factors ETOH use, advised to drink no more than 2 drink(s) a day  Other Active Problems CKD-creatinine 1.54  Hospital day # 4   Patient neurologically stable blood pressure adequately controlled.  Still complaining of nausea will consider discharge later patient feels better.  Long discussion patient and wife and answered questions.  I personally spent a total of  35 minutes in the care of the patient today including getting/reviewing separately obtained history, performing a medically appropriate exam/evaluation, counseling and educating, placing orders, referring and communicating with other health care professionals, documenting clinical information in the EHR, independently interpreting results, and coordinating care.           Charles Popp, MD Medical Director [facility] Pager: [phone] 12/12/2023 2:42 PM

## 2023-12-12 NOTE — Discharge Summary (Deleted)
 Stroke Discharge Summary  Patient ID: Charles Higgins   MRN: [id number]      DOB: 06-12-1980  Date of Admission: 12/08/2023 Date of Discharge: 12/12/2023  Attending Physician:  Stroke, Md, MD Patient's PCP:  Patient, No Pcp Per  DISCHARGE PRIMARY DIAGNOSIS: Left thlamic ICH with IVE   Patient Active Problem List   Diagnosis Date Noted   Stroke (cerebrum) (HCC) 12/08/2023   Hemorrhagic stroke (HCC) 12/08/2023   Metabolic syndrome 06/28/2015   Controlled diabetes mellitus type II without complication (HCC) 08/25/2013   Elevated serum creatinine 08/25/2013   Hyperlipidemia 06/23/2011   Umbilical hernia 06/23/2011   Hypertension 03/10/2011     Allergies as of 12/12/2023   No Known Allergies      Medication List     STOP taking these medications    gabapentin 300 MG capsule Commonly known as: NEURONTIN   metoprolol  succinate 100 MG 24 hr tablet Commonly known as: TOPROL -XL   rosuvastatin  5 MG tablet Commonly known as: Crestor    spironolactone  25 MG tablet Commonly known as: ALDACTONE    tiZANidine 4 MG tablet Commonly known as: ZANAFLEX       TAKE these medications    amLODipine  10 MG tablet Commonly known as: NORVASC  Take 1 tablet (10 mg total) by mouth daily.   atorvastatin  40 MG tablet Commonly known as: LIPITOR Take 1 tablet (40 mg total) by mouth daily. Start taking on: December 13, 2023   cloNIDine  0.1 MG tablet Commonly known as: CATAPRES  Take 1 tablet (0.1 mg total) by mouth 2 (two) times daily.   losartan -hydrochlorothiazide  100-25 MG tablet Commonly known as: HYZAAR Take 1 tablet by mouth daily.        LABORATORY STUDIES CBC    Component Value Date/Time   WBC 8.1 12/11/2023 0832   RBC 5.97 (H) 12/11/2023 0832   HGB 15.7 12/11/2023 0832   HCT 48.2 12/11/2023 0832   PLT 228 12/11/2023 0832   MCV 80.7 12/11/2023 0832   MCH 26.3 12/11/2023 0832   MCHC 32.6 12/11/2023 0832   RDW 13.5 12/11/2023 0832   LYMPHSABS 1.5 12/08/2023 1506    MONOABS 0.4 12/08/2023 1506   EOSABS 0.1 12/08/2023 1506   BASOSABS 0.0 12/08/2023 1506   CMP    Component Value Date/Time   NA 137 12/12/2023 0639   NA 140 01/31/2019 0728   K 3.2 (L) 12/12/2023 0639   CL 98 12/12/2023 0639   CO2 27 12/12/2023 0639   GLUCOSE 118 (H) 12/12/2023 0639   BUN 23 (H) 12/12/2023 0639   BUN 16 01/31/2019 0728   CREATININE 1.83 (H) 12/12/2023 0639   CREATININE 1.33 (H) 03/22/2022 0917   CALCIUM  9.2 12/12/2023 0639   PROT 7.4 12/10/2023 0827   ALBUMIN 3.9 12/10/2023 0827   AST 24 12/10/2023 0827   ALT 21 12/10/2023 0827   ALKPHOS 61 12/10/2023 0827   BILITOT 0.9 12/10/2023 0827   GFRNONAA 46 (L) 12/12/2023 0639   GFRNONAA 62 11/08/2018 1308   GFRAA 81 01/31/2019 0728   GFRAA 72 11/08/2018 1308   COAGSNo results found for: INR, PROTIME Lipid Panel    Component Value Date/Time   CHOL 240 (H) 12/10/2023 0827   TRIG 405 (H) 12/10/2023 0827   HDL 38 (L) 12/10/2023 0827   CHOLHDL 6.3 12/10/2023 0827   VLDL UNABLE TO CALCULATE IF TRIGLYCERIDE OVER 400 mg/dL 91/89/7974 9172   LDLCALC UNABLE TO CALCULATE IF TRIGLYCERIDE OVER 400 mg/dL 91/89/7974 9172   LDLCALC 147 (H) 09/14/2021  1213   HgbA1C  Lab Results  Component Value Date   HGBA1C 5.1 12/09/2023   Alcohol Level No results found for: [facility]   SIGNIFICANT DIAGNOSTIC STUDIES ECHOCARDIOGRAM COMPLETE Result Date: 12/09/2023    ECHOCARDIOGRAM REPORT   Patient Name:   Charles Higgins Date of Exam: 12/09/2023 Medical Rec #:  [id number]    Height:       68.0 in Accession #:    [id number]   Weight:       255.8 lb Date of Birth:  24-Mar-1981   BSA:          2.269 m Patient Age:    43 years     BP:           138/73 mmHg Patient Gender: M            HR:           72 bpm. Exam Location:  Inpatient Procedure: 2D Echo (Both Spectral and Color Flow Doppler were utilized during            procedure). Indications:    stroke  History:        Patient has no prior history of Echocardiogram examinations.                  Risk Factors:Diabetes, Hypertension and Dyslipidemia.  Referring Phys: [id number] ASHISH ARORA IMPRESSIONS  1. Left ventricular ejection fraction, by estimation, is 60 to 65%. The left ventricle has normal function. The left ventricle has no regional wall motion abnormalities. There is moderate left ventricular hypertrophy. Left ventricular diastolic parameters are indeterminate.  2. Right ventricular systolic function is normal. The right ventricular size is normal.  3. The mitral valve is normal in structure. No evidence of mitral valve regurgitation. No evidence of mitral stenosis.  4. The aortic valve is tricuspid. Aortic valve regurgitation is not visualized. No aortic stenosis is present.  5. Aortic dilatation noted. There is mild dilatation of the ascending aorta, measuring 37 mm.  6. The inferior vena cava is normal in size with greater than 50% respiratory variability, suggesting right atrial pressure of 3 mmHg. FINDINGS  Left Ventricle: Left ventricular ejection fraction, by estimation, is 60 to 65%. The left ventricle has normal function. The left ventricle has no regional wall motion abnormalities. Strain was performed and the global longitudinal strain is indeterminate. The left ventricular internal cavity size was normal in size. There is moderate left ventricular hypertrophy. Left ventricular diastolic parameters are indeterminate. Right Ventricle: The right ventricular size is normal. Right vetricular wall thickness was not well visualized. Right ventricular systolic function is normal. Left Atrium: Left atrial size was normal in size. Right Atrium: Right atrial size was normal in size. Pericardium: There is no evidence of pericardial effusion. Mitral Valve: The mitral valve is normal in structure. No evidence of mitral valve regurgitation. No evidence of mitral valve stenosis. Tricuspid Valve: The tricuspid valve is normal in structure. Tricuspid valve regurgitation is not demonstrated. No evidence of  tricuspid stenosis. Aortic Valve: The aortic valve is tricuspid. Aortic valve regurgitation is not visualized. No aortic stenosis is present. Aortic valve mean gradient measures 7.4 mmHg. Aortic valve peak gradient measures 16.6 mmHg. Aortic valve area, by VTI measures 1.88  cm. Pulmonic Valve: The pulmonic valve was not well visualized. Pulmonic valve regurgitation is not visualized. No evidence of pulmonic stenosis. Aorta: The aortic root is normal in size and structure and aortic dilatation noted. There is mild dilatation of the ascending aorta,  measuring 37 mm. Venous: The inferior vena cava is normal in size with greater than 50% respiratory variability, suggesting right atrial pressure of 3 mmHg. IAS/Shunts: No atrial level shunt detected by color flow Doppler.  LEFT VENTRICLE PLAX 2D LVIDd:         5.30 cm   Diastology LVIDs:         3.40 cm   LV e' medial:    6.53 cm/s LV PW:         1.50 cm   LV E/e' medial:  13.7 LV IVS:        1.40 cm   LV e' lateral:   7.94 cm/s LVOT diam:     2.00 cm   LV E/e' lateral: 11.2 LV SV:         67 LV SV Index:   29 LVOT Area:     3.14 cm  RIGHT VENTRICLE             IVC RV Basal diam:  3.00 cm     IVC diam: 1.60 cm RV S prime:     15.20 cm/s TAPSE (M-mode): 2.2 cm LEFT ATRIUM             Index        RIGHT ATRIUM           Index LA diam:        4.70 cm 2.07 cm/m   RA Area:     18.70 cm LA Vol (A2C):   78.1 ml 34.42 ml/m  RA Volume:   48.40 ml  21.33 ml/m LA Vol (A4C):   85.3 ml 37.59 ml/m LA Biplane Vol: 83.7 ml 36.89 ml/m  AORTIC VALVE AV Area (Vmax):    1.99 cm AV Area (Vmean):   2.00 cm AV Area (VTI):     1.88 cm AV Vmax:           203.61 cm/s AV Vmean:          124.024 cm/s AV VTI:            0.354 m AV Peak Grad:      16.6 mmHg AV Mean Grad:      7.4 mmHg LVOT Vmax:         129.00 cm/s LVOT Vmean:        78.900 cm/s LVOT VTI:          0.212 m LVOT/AV VTI ratio: 0.60  AORTA Ao Root diam: 3.30 cm Ao Asc diam:  3.70 cm MITRAL VALVE MV Area (PHT): 2.99 cm     SHUNTS MV Decel Time: 254 msec    Systemic VTI:  0.21 m MV E velocity: 89.30 cm/s  Systemic Diam: 2.00 cm MV A velocity: 42.60 cm/s MV E/A ratio:  2.10 Dorn Ross MD Electronically signed by Dorn Ross MD Signature Date/Time: 12/09/2023/3:10:24 PM    Final    MR BRAIN W WO CONTRAST Result Date: 12/09/2023 EXAM: MRI BRAIN WITH AND WITHOUT CONTRAST 12/09/2023 08:21:52 AM TECHNIQUE: Multiplanar multisequence MRI of the head/brain was performed with and without the administration of intravenous contrast. COMPARISON: CT head without contrast 12/08/2023. CLINICAL HISTORY: Stroke, hemorrhagic. FINDINGS: BRAIN AND VENTRICLES: 3.3 cm left posterior corona radiata hemorrhage is again noted. No hydrocephalus is present. No underlying mass lesion or pathologic enhancement is present. Periventricular and scattered subcortical T2 hyperintensities are moderately advanced for age. ORBITS: No acute abnormality. SINUSES: Mild mucosal thickening is present in the left frontal sinus, left greater than right ethmoid air  cells and left maxillary sinus. No fluid levels are present. BONES AND SOFT TISSUES: Normal bone marrow signal and enhancement. No acute soft tissue abnormality. IMPRESSION: 1. 3.3 cm left posterior corona radiata hemorrhage, stable compared to prior CT head without contrast on 12/08/23. 2. No hydrocephalus, underlying mass lesion, or pathologic enhancement. 3. Moderately advanced periventricular and scattered subcortical T2 hyperintensities for age. 4. Mild mucosal thickening in the left frontal sinus, left greater than right ethmoid air cells, and left maxillary sinus without fluid levels. Electronically signed by: Lonni Necessary MD 12/09/2023 08:55 AM EDT RP Workstation: [id number]   CT ANGIO HEAD NECK W WO CM Result Date: 12/09/2023 CLINICAL DATA:  Initial evaluation for acute stroke. EXAM: CT ANGIOGRAPHY HEAD AND NECK WITH AND WITHOUT CONTRAST TECHNIQUE: Multidetector CT imaging of the head and neck  was performed using the standard protocol during bolus administration of intravenous contrast. Multiplanar CT image reconstructions and MIPs were obtained to evaluate the vascular anatomy. Carotid stenosis measurements (when applicable) are obtained utilizing NASCET criteria, using the distal internal carotid diameter as the denominator. RADIATION DOSE REDUCTION: This exam was performed according to the departmental dose-optimization program which includes automated exposure control, adjustment of the mA and/or kV according to patient size and/or use of iterative reconstruction technique. CONTRAST:  75mL OMNIPAQUE  IOHEXOL  350 MG/ML SOLN COMPARISON:  Comparison made with prior CT from earlier the same day. FINDINGS: CTA NECK FINDINGS Aortic arch: Visualized aortic arch within normal limits for caliber with standard branch pattern. No stenosis about the origin the great vessels. Right carotid system: Right common and internal carotid arteries are patent without stenosis or dissection. Left carotid system: Left common and internal carotid arteries are patent without stenosis or dissection. Vertebral arteries: Left vertebral artery arises directly from the aortic arch. Vertebral arteries patent without stenosis or dissection. Skeleton: No discrete or worrisome osseous lesions. Other neck: No other acute finding. Upper chest: No other acute finding. Review of the MIP images confirms the above findings CTA HEAD FINDINGS Anterior circulation: Both internal carotid arteries widely patent to the termini without stenosis. A1 segments widely patent. Normal anterior communicating artery complex. Both anterior cerebral arteries widely patent to their distal aspects without stenosis. No M1 stenosis or occlusion. Normal MCA bifurcations. Distal MCA branches well perfused and symmetric. No significant beading. Posterior circulation: Both V4 segments patent without stenosis. Both PICA patent. Basilar patent without stenosis.  Superior cerebellar and posterior cerebral arteries patent bilaterally. Venous sinuses: Patent allowing for timing the contrast bolus. Anatomic variants: None significant. No intracranial aneurysm. No vascular abnormality seen underlying the left thalamic hemorrhage. No spot sign. No abnormal enhancement on delayed images. Review of the MIP images confirms the above findings IMPRESSION: Negative CTA of the head and neck. No large vessel occlusion, hemodynamically significant stenosis, or other acute vascular abnormality. No vascular abnormality seen underlying the left thalamic hemorrhage. Electronically Signed   By: Morene Hoard M.D.   On: 12/09/2023 03:01   CT HEAD POST STROKE FOLLOWUP/TIMED/STAT READ Result Date: 12/08/2023 CLINICAL DATA:  Follow-up examination for hemorrhagic stroke. EXAM: CT HEAD WITHOUT CONTRAST TECHNIQUE: Contiguous axial images were obtained from the base of the skull through the vertex without intravenous contrast. RADIATION DOSE REDUCTION: This exam was performed according to the departmental dose-optimization program which includes automated exposure control, adjustment of the mA and/or kV according to patient size and/or use of iterative reconstruction technique. COMPARISON:  CT from earlier the same day. FINDINGS: Brain: Previously identified acute hemorrhage involving the left thalamus again  seen, not significantly changed in size and morphology as compared to previous. No significant surrounding edema or regional mass effect. Intraventricular extension with blood in the left lateral and third ventricles. Appearance is similar to prior. No hydrocephalus or ventricular trapping. No other new acute intracranial hemorrhage. No other acute large vessel territory infarct. No mass lesion or significant midline shift. No extra-axial fluid collection. Vascular: No abnormal hyperdense vessel. Skull: Scalp soft tissues and calvarium demonstrate no new finding. Sinuses/Orbits: Globes  orbital soft tissues within normal limits. Scattered mucosal thickening noted throughout the paranasal sinuses. No significant mastoid effusion. Other: None. IMPRESSION: 1. No significant interval change in size and morphology of acute left thalamic hemorrhage with intraventricular extension. No hydrocephalus or ventricular trapping. 2. No other new acute intracranial abnormality. Electronically Signed   By: Morene Hoard M.D.   On: 12/08/2023 23:55   CT Head Wo Contrast Result Date: 12/08/2023 CLINICAL DATA:  Mental status change. Fatigue with confusion and headache. Elevated blood pressure in triage. EXAM: CT HEAD WITHOUT CONTRAST TECHNIQUE: Contiguous axial images were obtained from the base of the skull through the vertex without intravenous contrast. RADIATION DOSE REDUCTION: This exam was performed according to the departmental dose-optimization program which includes automated exposure control, adjustment of the mA and/or kV according to patient size and/or use of iterative reconstruction technique. COMPARISON:  None Available. FINDINGS: Brain: There is focus of acute hemorrhage over the superior aspect of the left thalamus and extending into the left lateral ventricle. This measures approximately 2.9 x 1.2 cm in AP and transverse dimension over the superior aspect of the left thalamus. There is mild local mass effect. No midline shift. Ventricles are otherwise normal and size. Remaining cisterns and CSF spaces are unremarkable. Vascular: No hyperdense vessel or unexpected calcification. Skull: Normal. Negative for fracture or focal lesion. Sinuses/Orbits: Mild chronic inflammatory change over the sinuses most notable over the left maxillary sinus. Visualized orbits are unremarkable. Mastoid air cells are clear. Other: None. IMPRESSION: 1. Acute hemorrhage over the superior aspect of the left thalamus and extending into the left lateral ventricle likely hypertensive in etiology. Critical  Value/emergent results were called by telephone at the time of interpretation on 12/08/2023 at 4:09 pm to provider [facility] , who verbally acknowledged these results. Electronically Signed   By: Toribio Agreste M.D.   On: 12/08/2023 16:09   DG Chest Portable 1 View Result Date: 12/08/2023 EXAM: 1 VIEW XRAY OF THE CHEST 12/08/2023 03:24:00 PM COMPARISON: 03/09/2021 CLINICAL HISTORY: AMS. AMS AMS. AMS FINDINGS: LUNGS AND PLEURA: No focal pulmonary opacity. No pulmonary edema. No pleural effusion. No pneumothorax. HEART AND MEDIASTINUM: No acute abnormality of the cardiac and mediastinal silhouettes. BONES AND SOFT TISSUES: No acute osseous abnormality. IMPRESSION: 1. No acute process. Electronically signed by: Dayne Hassell MD 12/08/2023 03:37 PM EDT RP Workstation: [id number]       HISTORY OF PRESENT ILLNESS  43 y.o. male with history of HTN, DM, HLD, and CKD noncompliance with medications presenting with altered mental status and confusion. On arrival to the ED, his BP was 204/156.  Reports some word finding difficulty.  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  ICH score 1  HOSPITAL COURSE Intracerebral Hemorrhage and IVH:  left thalamus  Etiology: Hypertensive and uncontrolled risk factors CT head Acute hemorrhage over the superior aspect of the left thalamus and extending into the left lateral ventricle likely hypertensive in etiology.  Repeat CTH Stable  CTA head & neck  No LVO  MRI  3.3 cm left posterior corona radiata hemorrhage  2D Echo EF 60 to 65%.  Moderate LVH LDL 98 HgbA1c 6.1 UDS negative VTE prophylaxis -SCDs No antithrombotic prior to admission, continue No antithrombotic due to acute ICH  Therapy recommendations:  No PT follow-up, outpatient cognitive rehab ordered Disposition: home   Hypertensive Emergency Home meds: Norvasc , Hyzaar and clonidine -noncompliant with medication Medication compliance education  provided Stable, weaned off Cleviprex  8/11 Continue home meds on discharge, prescriptions provided for 1 month on discharge.    Hyperlipidemia Home meds: Crestor  5 mg LDL 98 goal < 70 Start Lipitor 40mg  on discharge   Hypokalemia K3.2, replaced Follow up with PCP   Other Stroke Risk Factors ETOH use, advised to drink no more than 2 drink(s) a day  DISCHARGE EXAM  PHYSICAL EXAM General:  Alert, well-nourished, well-developed patient in no acute distress Psych:  Mood and affect appropriate for situation CV: Regular rate and rhythm on monitor Respiratory:  Regular, unlabored respirations on room air GI: Abdomen soft and nontender  NEURO:  Mental Status: AA&Ox3  Speech/Language: Speech is without dysarthria, but continues to have word finding issues and aphasia. Impaired memory and recall.   Cranial Nerves:  II: PERRL. Visual fields full.  III, IV, VI: EOMI. Eyelids elevate symmetrically.  V: Sensation is intact to light touch and symmetrical to face.  VII: Smile is symmetrical.  VIII: hearing intact to voice. IX, X: Palate elevates symmetrically. Phonation is normal.  KP:Dynloizm shrug 5/5. XII: tongue is midline without fasciculations. Motor: 5/5 strength to all muscle groups tested.  Tone: is normal and bulk is normal Sensation- Intact to light touch bilaterally. Extinction absent to light touch to DSS. Coordination: FTN intact bilaterally, HKS: no ataxia in BLE.No drift.  Gait- deferred  Discharge NIH: 1a Level of Conscious.: 0 1b LOC Questions: 0 1c LOC Commands: 0 2 Best Gaze: 0 3 Visual: 0 4 Facial Palsy: 0 5a Motor Arm - left: 0 5b Motor Arm - Right: 0 6a Motor Leg - Left: 0 6b Motor Leg - Right: 0 7 Limb Ataxia: 0 8 Sensory: 0 9 Best Language: 1 10 Dysarthria: 0 11 Extinct. and Inatten.: 0 TOTAL: 1  Discharge Diet       Diet   Diet Heart Room service appropriate? Yes with Assist; Fluid consistency: Thin   liquids  DISCHARGE PLAN Disposition:  Home No antithrombotic due to ICH.  Ongoing stroke risk factor control by Primary Care Physician at time of discharge Follow-up PCP Patient, No Pcp Per in 2 weeks. Follow-up in [facility] in 8 weeks, office to schedule an appointment. Able to see NP in clinic.  35 minutes were spent preparing discharge.   Pt seen by Neuro NP/APP and later by MD. Note/plan to be edited by MD as needed.    Rocky JAYSON Likes, DNP, AGACNP-BC [facility] Please use AMION for contact information & EPIC for messaging.

## 2023-12-12 NOTE — Care Management (Addendum)
 Referral placed to [facility] neuro for PT OT  per provider request. Added resource for primary care on AVS Spoke w wife and she states that he goes to [facility] for primary care services, Cammie Fulp.

## 2023-12-12 NOTE — Plan of Care (Signed)
  Problem: Education: Goal: Knowledge of General Education information will improve Description: Including pain rating scale, medication(s)/side effects and non-pharmacologic comfort measures Outcome: Progressing   Problem: Health Behavior/Discharge Planning: Goal: Ability to manage health-related needs will improve Outcome: Progressing   Problem: Clinical Measurements: Goal: Ability to maintain clinical measurements within normal limits will improve Outcome: Progressing Goal: Diagnostic test results will improve Outcome: Progressing Goal: Cardiovascular complication will be avoided Outcome: Progressing   Problem: Coping: Goal: Level of anxiety will decrease Outcome: Progressing   Problem: Clinical Measurements: Goal: Will remain free from infection Outcome: Adequate for Discharge Goal: Respiratory complications will improve Outcome: Adequate for Discharge   Problem: Activity: Goal: Risk for activity intolerance will decrease Outcome: Adequate for Discharge   Problem: Nutrition: Goal: Adequate nutrition will be maintained Outcome: Adequate for Discharge   Problem: Elimination: Goal: Will not experience complications related to bowel motility Outcome: Adequate for Discharge Goal: Will not experience complications related to urinary retention Outcome: Adequate for Discharge   Problem: Pain Managment: Goal: General experience of comfort will improve and/or be controlled Outcome: Adequate for Discharge   Problem: Safety: Goal: Ability to remain free from injury will improve Outcome: Adequate for Discharge   Problem: Skin Integrity: Goal: Risk for impaired skin integrity will decrease Outcome: Adequate for Discharge   Problem: Education: Goal: Knowledge of disease or condition will improve Outcome: Adequate for Discharge Goal: Knowledge of secondary prevention will improve (MUST DOCUMENT ALL) Outcome: Adequate for Discharge Goal: Knowledge of patient specific risk  factors will improve (DELETE if not current risk factor) Outcome: Adequate for Discharge   Problem: Intracerebral Hemorrhage Tissue Perfusion: Goal: Complications of Intracerebral Hemorrhage will be minimized Outcome: Adequate for Discharge   Problem: Coping: Goal: Will verbalize positive feelings about self Outcome: Adequate for Discharge Goal: Will identify appropriate support needs Outcome: Adequate for Discharge   Problem: Health Behavior/Discharge Planning: Goal: Ability to manage health-related needs will improve Outcome: Adequate for Discharge Goal: Goals will be collaboratively established with patient/family Outcome: Adequate for Discharge   Problem: Self-Care: Goal: Ability to participate in self-care as condition permits will improve Outcome: Adequate for Discharge Goal: Verbalization of feelings and concerns over difficulty with self-care will improve Outcome: Adequate for Discharge Goal: Ability to communicate needs accurately will improve Outcome: Adequate for Discharge   Problem: Nutrition: Goal: Risk of aspiration will decrease Outcome: Adequate for Discharge Goal: Dietary intake will improve Outcome: Adequate for Discharge

## 2023-12-12 NOTE — Plan of Care (Signed)
  Problem: Education: Goal: Knowledge of General Education information will improve Description: Including pain rating scale, medication(s)/side effects and non-pharmacologic comfort measures Outcome: Progressing   Problem: Health Behavior/Discharge Planning: Goal: Ability to manage health-related needs will improve Outcome: Progressing   Problem: Clinical Measurements: Goal: Ability to maintain clinical measurements within normal limits will improve Outcome: Progressing Goal: Will remain free from infection Outcome: Progressing Goal: Diagnostic test results will improve Outcome: Progressing Goal: Respiratory complications will improve Outcome: Progressing Goal: Cardiovascular complication will be avoided Outcome: Progressing   Problem: Activity: Goal: Risk for activity intolerance will decrease Outcome: Progressing   Problem: Nutrition: Goal: Adequate nutrition will be maintained Outcome: Progressing   Problem: Coping: Goal: Level of anxiety will decrease Outcome: Progressing   Problem: Elimination: Goal: Will not experience complications related to bowel motility Outcome: Progressing Goal: Will not experience complications related to urinary retention Outcome: Progressing   Problem: Pain Managment: Goal: General experience of comfort will improve and/or be controlled Outcome: Progressing   Problem: Safety: Goal: Ability to remain free from injury will improve Outcome: Progressing   Problem: Skin Integrity: Goal: Risk for impaired skin integrity will decrease Outcome: Progressing   Problem: Education: Goal: Knowledge of disease or condition will improve Outcome: Progressing Goal: Knowledge of secondary prevention will improve (MUST DOCUMENT ALL) Outcome: Progressing Goal: Knowledge of patient specific risk factors will improve (DELETE if not current risk factor) Outcome: Progressing   Problem: Intracerebral Hemorrhage Tissue Perfusion: Goal: Complications  of Intracerebral Hemorrhage will be minimized Outcome: Progressing   Problem: Coping: Goal: Will verbalize positive feelings about self Outcome: Progressing Goal: Will identify appropriate support needs Outcome: Progressing   Problem: Health Behavior/Discharge Planning: Goal: Ability to manage health-related needs will improve Outcome: Progressing Goal: Goals will be collaboratively established with patient/family Outcome: Progressing   Problem: Self-Care: Goal: Ability to participate in self-care as condition permits will improve Outcome: Progressing Goal: Verbalization of feelings and concerns over difficulty with self-care will improve Outcome: Progressing Goal: Ability to communicate needs accurately will improve Outcome: Progressing   Problem: Nutrition: Goal: Risk of aspiration will decrease Outcome: Progressing Goal: Dietary intake will improve Outcome: Progressing

## 2023-12-13 DIAGNOSIS — R29701 NIHSS score 1: Secondary | ICD-10-CM | POA: Diagnosis not present

## 2023-12-13 DIAGNOSIS — I618 Other nontraumatic intracerebral hemorrhage: Secondary | ICD-10-CM | POA: Diagnosis not present

## 2023-12-13 DIAGNOSIS — I615 Nontraumatic intracerebral hemorrhage, intraventricular: Secondary | ICD-10-CM | POA: Diagnosis not present

## 2023-12-13 DIAGNOSIS — E876 Hypokalemia: Secondary | ICD-10-CM | POA: Insufficient documentation

## 2023-12-13 DIAGNOSIS — I161 Hypertensive emergency: Secondary | ICD-10-CM | POA: Insufficient documentation

## 2023-12-13 MED ORDER — BUTALBITAL-APAP-CAFFEINE 50-325-40 MG PO TABS
2.0000 | ORAL_TABLET | Freq: Four times a day (QID) | ORAL | Status: DC | PRN
  Administered 2023-12-13 (×2): 2 via ORAL
  Filled 2023-12-13: qty 2

## 2023-12-13 MED ORDER — POLYETHYLENE GLYCOL 3350 17 G PO PACK
17.0000 g | PACK | Freq: Every day | ORAL | Status: DC
  Administered 2023-12-13 (×2): 17 g via ORAL
  Filled 2023-12-13: qty 1

## 2023-12-13 MED ORDER — BUTALBITAL-APAP-CAFFEINE 50-325-40 MG PO TABS: 2.0000 | ORAL_TABLET | Freq: Four times a day (QID) | ORAL | 0 refills | Status: AC | PRN

## 2023-12-13 MED ORDER — DOCUSATE SODIUM 100 MG PO CAPS
100.0000 mg | ORAL_CAPSULE | Freq: Every day | ORAL | Status: DC
  Administered 2023-12-13 (×2): 100 mg via ORAL
  Filled 2023-12-13: qty 1

## 2023-12-13 MED ORDER — BISACODYL 10 MG RE SUPP
10.0000 mg | Freq: Once | RECTAL | Status: AC
  Administered 2023-12-13 (×2): 10 mg via RECTAL
  Filled 2023-12-13: qty 1

## 2023-12-13 NOTE — Progress Notes (Signed)
 AVS instructions given to Pt's spouse, verbalized understanding, will wheel him to the main entrance A to spouse car

## 2023-12-13 NOTE — TOC Transition Note (Signed)
 Transition of Care [facility]) - Discharge Note   Patient Details  Name: Charles Higgins MRN: [id number] Date of Birth: Feb 25, 1981  [facility] CM/SW Contact:  Almarie CHRISTELLA Goodie, LCSW Phone Number: 12/13/2023, 4:11 PM   Clinical Narrative:   Patient discharging home, referrals sent for outpatient therapy to neurorehab office, information placed on AVS. Family at bedside for transportation. No further ICM needs identified at this time.    Final next level of care: OP Rehab Barriers to Discharge: Barriers Resolved   Patient Goals and CMS Choice            Discharge Placement                       Discharge Plan and Services Additional resources added to the After Visit Summary for                                       Social Drivers of Health (SDOH) Interventions SDOH Screenings   Food Insecurity: Patient Declined (12/10/2023)  Housing: Patient Declined (12/10/2023)  Transportation Needs: Patient Declined (12/10/2023)  Utilities: Patient Declined (12/10/2023)  Depression (PHQ2-9): Low Risk  (03/22/2022)  Tobacco Use: Low Risk  (12/08/2023)     Readmission Risk Interventions     No data to display

## 2023-12-13 NOTE — Progress Notes (Signed)
 Speech Language Pathology Treatment: Cognitive-Linguistic  Patient Details Name: Charles Higgins MRN: [id number] DOB: 07/18/1980 Today's Date: 12/13/2023 Time: 1140-1210 SLP Time Calculation (min) (ACUTE ONLY): 30 min  Assessment / Plan / Recommendation Clinical Impression  Pt seen for aphasia tx and ongoing cognitive assessment this date.  Pt with expressive aphasia during conversational tasks and divergent/convergent naming tasks.  He stated the way I process information is different now.  Pt required min category cues and sentence completion/phonemic cues to recall familiar and unfamiliar categories.  Pt would hesitate and perseverated on responses intermittently during tx session when volitional naming tasks/timed tasks were introduced.  Pt's cognition may require further assessment as he attempted to use the restroom I after bed alarm sounded with reminders to wait for A, but eventually required  A from SLP and his Uncle.  Discussed using urinal in future as a problem solving measure and pt in agreement.  Safety awareness/decreased recall of safety precautions a potential concern and discussed with pt/uncle.  Spoke with pt's wife via phone re: goals of care.  Recommend continuing ST in acute setting for expressive aphasia/cognition.  SLP should f/u at next venue of care.    HPI HPI: Pt is a 43 y.o. male who presented with AMS and confusion. Reports some word finding difficulty. CT head (12/08/23) revealed acute left thalamic hemorrhage with intraventricular hemorrhage-no evidence of hydrocephalus. PMH: HTN, DM, HLD, and CKD. ST f/u for aphasia tx.      SLP Plan  Continue with current plan of care          Recommendations   F/u at next venue of care for aphasia tx; continue ST during acute stay; intermittent supervision for safety                      Other (comment) (TBD) Aphasia (R47.01);Cognitive communication deficit (R41.841)     Continue with current plan of care      Pat Charles Higgins,M.S.,CCC-SLP  12/13/2023, 1:42 PM

## 2023-12-13 NOTE — Discharge Summary (Addendum)
 Stroke Discharge Summary  Patient ID: Charles Higgins   MRN: [id number]      DOB: December 29, 1980  Date of Admission: 12/08/2023 Date of Discharge: 12/13/2023  Attending Physician:  Stroke, Md, MD Consultant(s):    None  Patient's PCP:  [facility]   DISCHARGE PRIMARY DIAGNOSIS: Intracerebral Hemorrhage and IVH:  left thalamus  Etiology: Hypertensive and uncontrolled risk factors   Patient Active Problem List   Diagnosis Date Noted   Hypertensive emergency 12/13/2023   Hypokalemia 12/13/2023   Hemorrhagic stroke (HCC) 12/08/2023   Metabolic syndrome 06/28/2015   Controlled diabetes mellitus type II without complication (HCC) 08/25/2013   Elevated serum creatinine 08/25/2013   Hyperlipidemia 06/23/2011   Umbilical hernia 06/23/2011   Hypertension 03/10/2011     Allergies as of 12/13/2023   No Known Allergies      Medication List     STOP taking these medications    gabapentin 300 MG capsule Commonly known as: NEURONTIN   metoprolol  succinate 100 MG 24 hr tablet Commonly known as: TOPROL -XL   rosuvastatin  5 MG tablet Commonly known as: Crestor    spironolactone  25 MG tablet Commonly known as: ALDACTONE    tiZANidine 4 MG tablet Commonly known as: ZANAFLEX       TAKE these medications    amLODipine  10 MG tablet Commonly known as: NORVASC  Take 1 tablet (10 mg total) by mouth daily.   atorvastatin  40 MG tablet Commonly known as: LIPITOR Take 1 tablet (40 mg total) by mouth daily.   butalbital -acetaminophen -caffeine  50-325-40 MG tablet Commonly known as: FIORICET  Take 2 tablets by mouth every 6 (six) hours as needed for headache.   cloNIDine  0.1 MG tablet Commonly known as: CATAPRES  Take 1 tablet (0.1 mg total) by mouth 2 (two) times daily.   losartan -hydrochlorothiazide  100-25 MG tablet Commonly known as: HYZAAR Take 1 tablet by mouth daily.   ondansetron  4 MG disintegrating tablet Commonly known as: ZOFRAN -ODT Take 1 tablet (4 mg total) by  mouth every 8 (eight) hours as needed for nausea or vomiting.        LABORATORY STUDIES CBC    Component Value Date/Time   WBC 8.1 12/11/2023 0832   RBC 5.97 (H) 12/11/2023 0832   HGB 15.7 12/11/2023 0832   HCT 48.2 12/11/2023 0832   PLT 228 12/11/2023 0832   MCV 80.7 12/11/2023 0832   MCH 26.3 12/11/2023 0832   MCHC 32.6 12/11/2023 0832   RDW 13.5 12/11/2023 0832   LYMPHSABS 1.5 12/08/2023 1506   MONOABS 0.4 12/08/2023 1506   EOSABS 0.1 12/08/2023 1506   BASOSABS 0.0 12/08/2023 1506   CMP    Component Value Date/Time   NA 137 12/12/2023 0639   NA 140 01/31/2019 0728   K 3.2 (L) 12/12/2023 0639   CL 98 12/12/2023 0639   CO2 27 12/12/2023 0639   GLUCOSE 118 (H) 12/12/2023 0639   BUN 23 (H) 12/12/2023 0639   BUN 16 01/31/2019 0728   CREATININE 1.83 (H) 12/12/2023 0639   CREATININE 1.33 (H) 03/22/2022 0917   CALCIUM  9.2 12/12/2023 0639   PROT 7.4 12/10/2023 0827   ALBUMIN 3.9 12/10/2023 0827   AST 24 12/10/2023 0827   ALT 21 12/10/2023 0827   ALKPHOS 61 12/10/2023 0827   BILITOT 0.9 12/10/2023 0827   GFRNONAA 46 (L) 12/12/2023 0639   GFRNONAA 62 11/08/2018 1308   GFRAA 81 01/31/2019 0728   GFRAA 72 11/08/2018 1308   COAGSNo results found for: INR, PROTIME Lipid Panel  Component Value Date/Time   CHOL 240 (H) 12/10/2023 0827   TRIG 405 (H) 12/10/2023 0827   HDL 38 (L) 12/10/2023 0827   CHOLHDL 6.3 12/10/2023 0827   VLDL UNABLE TO CALCULATE IF TRIGLYCERIDE OVER 400 mg/dL 91/89/7974 9172   LDLCALC UNABLE TO CALCULATE IF TRIGLYCERIDE OVER 400 mg/dL 91/89/7974 9172   LDLCALC 147 (H) 09/14/2021 1213   HgbA1C  Lab Results  Component Value Date   HGBA1C 5.1 12/09/2023   Alcohol Level No results found for: ETH   SIGNIFICANT DIAGNOSTIC STUDIES CT HEAD WO CONTRAST ( ) Result Date: 12/12/2023 EXAM: CT HEAD WITHOUT CONTRAST 12/12/2023 02:17:10 PM TECHNIQUE: CT of the head was performed without the administration of intravenous contrast. Automated  exposure control, iterative reconstruction, and/or weight based adjustment of the mA/kV was utilized to reduce the radiation dose to as low as reasonably achievable. COMPARISON: CT of the head dated 12/09/2023. CLINICAL HISTORY: Stroke, hemorrhagic. FINDINGS: BRAIN AND VENTRICLES: There has been interval improvement of intraventricular hemorrhage within the left lateral ventricle. The hemorrhage within the left thalamus is unchanged in the interim. There are mild edematous changes along the lateral margin of the hemorrhage but there is no significant cerebral swelling or shift to the midline structures. There is no evidence of interval hemorrhage. ORBITS: The orbits are unremarkable. SINUSES: There is mucosal disease within the ethmoid and maxillary sinuses. SOFT TISSUES AND SKULL: No acute soft tissue abnormality. No skull fracture. IMPRESSION: 1. Interval improvement of intraventricular hemorrhage within the left lateral ventricle. 2. Unchanged hemorrhage within the left thalamus with mild edematous changes along the lateral margin. No significant cerebral swelling or midline shift. 3. No evidence of interval hemorrhage. Electronically signed by: evalene coho 12/12/2023 02:36 PM EDT RP Workstation: [id number]   ECHOCARDIOGRAM COMPLETE Result Date: 12/09/2023    ECHOCARDIOGRAM REPORT   Patient Name:   Charles Higgins Date of Exam: 12/09/2023 Medical Rec #:  [id number]    Height:       68.0 in Accession #:    [id number]   Weight:       255.8 lb Date of Birth:  [birth year]   BSA:          2.269 m Patient Age:    42 years     BP:           138/73 mmHg Patient Gender: M            HR:           72 bpm. Exam Location:  Inpatient Procedure: 2D Echo (Both Spectral and Color Flow Doppler were utilized during            procedure). Indications:    stroke  History:        Patient has no prior history of Echocardiogram examinations.                 Risk Factors:Diabetes, Hypertension and Dyslipidemia.  Referring Phys: [id number]  ASHISH ARORA IMPRESSIONS  1. Left ventricular ejection fraction, by estimation, is 60 to 65%. The left ventricle has normal function. The left ventricle has no regional wall motion abnormalities. There is moderate left ventricular hypertrophy. Left ventricular diastolic parameters are indeterminate.  2. Right ventricular systolic function is normal. The right ventricular size is normal.  3. The mitral valve is normal in structure. No evidence of mitral valve regurgitation. No evidence of mitral stenosis.  4. The aortic valve is tricuspid. Aortic valve regurgitation is not visualized. No aortic stenosis is present.  5. Aortic  dilatation noted. There is mild dilatation of the ascending aorta, measuring 37 mm.  6. The inferior vena cava is normal in size with greater than 50% respiratory variability, suggesting right atrial pressure of 3 mmHg. FINDINGS  Left Ventricle: Left ventricular ejection fraction, by estimation, is 60 to 65%. The left ventricle has normal function. The left ventricle has no regional wall motion abnormalities. Strain was performed and the global longitudinal strain is indeterminate. The left ventricular internal cavity size was normal in size. There is moderate left ventricular hypertrophy. Left ventricular diastolic parameters are indeterminate. Right Ventricle: The right ventricular size is normal. Right vetricular wall thickness was not well visualized. Right ventricular systolic function is normal. Left Atrium: Left atrial size was normal in size. Right Atrium: Right atrial size was normal in size. Pericardium: There is no evidence of pericardial effusion. Mitral Valve: The mitral valve is normal in structure. No evidence of mitral valve regurgitation. No evidence of mitral valve stenosis. Tricuspid Valve: The tricuspid valve is normal in structure. Tricuspid valve regurgitation is not demonstrated. No evidence of tricuspid stenosis. Aortic Valve: The aortic valve is tricuspid. Aortic valve  regurgitation is not visualized. No aortic stenosis is present. Aortic valve mean gradient measures 7.4 mmHg. Aortic valve peak gradient measures 16.6 mmHg. Aortic valve area, by VTI measures 1.88  cm. Pulmonic Valve: The pulmonic valve was not well visualized. Pulmonic valve regurgitation is not visualized. No evidence of pulmonic stenosis. Aorta: The aortic root is normal in size and structure and aortic dilatation noted. There is mild dilatation of the ascending aorta, measuring 37 mm. Venous: The inferior vena cava is normal in size with greater than 50% respiratory variability, suggesting right atrial pressure of 3 mmHg. IAS/Shunts: No atrial level shunt detected by color flow Doppler.  LEFT VENTRICLE PLAX 2D LVIDd:         5.30 cm   Diastology LVIDs:         3.40 cm   LV e' medial:    6.53 cm/s LV PW:         1.50 cm   LV E/e' medial:  13.7 LV IVS:        1.40 cm   LV e' lateral:   7.94 cm/s LVOT diam:     2.00 cm   LV E/e' lateral: 11.2 LV SV:         67 LV SV Index:   29 LVOT Area:     3.14 cm  RIGHT VENTRICLE             IVC RV Basal diam:  3.00 cm     IVC diam: 1.60 cm RV S prime:     15.20 cm/s TAPSE (M-mode): 2.2 cm LEFT ATRIUM             Index        RIGHT ATRIUM           Index LA diam:        4.70 cm 2.07 cm/m   RA Area:     18.70 cm LA Vol (A2C):   78.1 ml 34.42 ml/m  RA Volume:   48.40 ml  21.33 ml/m LA Vol (A4C):   85.3 ml 37.59 ml/m LA Biplane Vol: 83.7 ml 36.89 ml/m  AORTIC VALVE AV Area (Vmax):    1.99 cm AV Area (Vmean):   2.00 cm AV Area (VTI):     1.88 cm AV Vmax:           203.61 cm/s  AV Vmean:          124.024 cm/s AV VTI:            0.354 m AV Peak Grad:      16.6 mmHg AV Mean Grad:      7.4 mmHg LVOT Vmax:         129.00 cm/s LVOT Vmean:        78.900 cm/s LVOT VTI:          0.212 m LVOT/AV VTI ratio: 0.60  AORTA Ao Root diam: 3.30 cm Ao Asc diam:  3.70 cm MITRAL VALVE MV Area (PHT): 2.99 cm    SHUNTS MV Decel Time: 254 msec    Systemic VTI:  0.21 m MV E velocity: 89.30 cm/s   Systemic Diam: 2.00 cm MV A velocity: 42.60 cm/s MV E/A ratio:  2.10 Dorn Ross MD Electronically signed by Dorn Ross MD Signature Date/Time: 12/09/2023/3:10:24 PM    Final    MR BRAIN W WO CONTRAST Result Date: 12/09/2023 EXAM: MRI BRAIN WITH AND WITHOUT CONTRAST 12/09/2023 08:21:52 AM TECHNIQUE: Multiplanar multisequence MRI of the head/brain was performed with and without the administration of intravenous contrast. COMPARISON: CT head without contrast 12/08/2023. CLINICAL HISTORY: Stroke, hemorrhagic. FINDINGS: BRAIN AND VENTRICLES: 3.3 cm left posterior corona radiata hemorrhage is again noted. No hydrocephalus is present. No underlying mass lesion or pathologic enhancement is present. Periventricular and scattered subcortical T2 hyperintensities are moderately advanced for age. ORBITS: No acute abnormality. SINUSES: Mild mucosal thickening is present in the left frontal sinus, left greater than right ethmoid air cells and left maxillary sinus. No fluid levels are present. BONES AND SOFT TISSUES: Normal bone marrow signal and enhancement. No acute soft tissue abnormality. IMPRESSION: 1. 3.3 cm left posterior corona radiata hemorrhage, stable compared to prior CT head without contrast on 12/08/23. 2. No hydrocephalus, underlying mass lesion, or pathologic enhancement. 3. Moderately advanced periventricular and scattered subcortical T2 hyperintensities for age. 4. Mild mucosal thickening in the left frontal sinus, left greater than right ethmoid air cells, and left maxillary sinus without fluid levels. Electronically signed by: Lonni Necessary MD 12/09/2023 08:55 AM EDT RP Workstation: [id number]   CT ANGIO HEAD NECK W WO CM Result Date: 12/09/2023 CLINICAL DATA:  Initial evaluation for acute stroke. EXAM: CT ANGIOGRAPHY HEAD AND NECK WITH AND WITHOUT CONTRAST TECHNIQUE: Multidetector CT imaging of the head and neck was performed using the standard protocol during bolus administration of intravenous  contrast. Multiplanar CT image reconstructions and MIPs were obtained to evaluate the vascular anatomy. Carotid stenosis measurements (when applicable) are obtained utilizing NASCET criteria, using the distal internal carotid diameter as the denominator. RADIATION DOSE REDUCTION: This exam was performed according to the departmental dose-optimization program which includes automated exposure control, adjustment of the mA and/or kV according to patient size and/or use of iterative reconstruction technique. CONTRAST:  75mL OMNIPAQUE  IOHEXOL  350 MG/ML SOLN COMPARISON:  Comparison made with prior CT from earlier the same day. FINDINGS: CTA NECK FINDINGS Aortic arch: Visualized aortic arch within normal limits for caliber with standard branch pattern. No stenosis about the origin the great vessels. Right carotid system: Right common and internal carotid arteries are patent without stenosis or dissection. Left carotid system: Left common and internal carotid arteries are patent without stenosis or dissection. Vertebral arteries: Left vertebral artery arises directly from the aortic arch. Vertebral arteries patent without stenosis or dissection. Skeleton: No discrete or worrisome osseous lesions. Other neck: No other acute finding. Upper chest: No other acute  finding. Review of the MIP images confirms the above findings CTA HEAD FINDINGS Anterior circulation: Both internal carotid arteries widely patent to the termini without stenosis. A1 segments widely patent. Normal anterior communicating artery complex. Both anterior cerebral arteries widely patent to their distal aspects without stenosis. No M1 stenosis or occlusion. Normal MCA bifurcations. Distal MCA branches well perfused and symmetric. No significant beading. Posterior circulation: Both V4 segments patent without stenosis. Both PICA patent. Basilar patent without stenosis. Superior cerebellar and posterior cerebral arteries patent bilaterally. Venous sinuses:  Patent allowing for timing the contrast bolus. Anatomic variants: None significant. No intracranial aneurysm. No vascular abnormality seen underlying the left thalamic hemorrhage. No spot sign. No abnormal enhancement on delayed images. Review of the MIP images confirms the above findings IMPRESSION: Negative CTA of the head and neck. No large vessel occlusion, hemodynamically significant stenosis, or other acute vascular abnormality. No vascular abnormality seen underlying the left thalamic hemorrhage. Electronically Signed   By: Morene Hoard M.D.   On: 12/09/2023 03:01   CT HEAD POST STROKE FOLLOWUP/TIMED/STAT READ Result Date: 12/08/2023 CLINICAL DATA:  Follow-up examination for hemorrhagic stroke. EXAM: CT HEAD WITHOUT CONTRAST TECHNIQUE: Contiguous axial images were obtained from the base of the skull through the vertex without intravenous contrast. RADIATION DOSE REDUCTION: This exam was performed according to the departmental dose-optimization program which includes automated exposure control, adjustment of the mA and/or kV according to patient size and/or use of iterative reconstruction technique. COMPARISON:  CT from earlier the same day. FINDINGS: Brain: Previously identified acute hemorrhage involving the left thalamus again seen, not significantly changed in size and morphology as compared to previous. No significant surrounding edema or regional mass effect. Intraventricular extension with blood in the left lateral and third ventricles. Appearance is similar to prior. No hydrocephalus or ventricular trapping. No other new acute intracranial hemorrhage. No other acute large vessel territory infarct. No mass lesion or significant midline shift. No extra-axial fluid collection. Vascular: No abnormal hyperdense vessel. Skull: Scalp soft tissues and calvarium demonstrate no new finding. Sinuses/Orbits: Globes orbital soft tissues within normal limits. Scattered mucosal thickening noted throughout  the paranasal sinuses. No significant mastoid effusion. Other: None. IMPRESSION: 1. No significant interval change in size and morphology of acute left thalamic hemorrhage with intraventricular extension. No hydrocephalus or ventricular trapping. 2. No other new acute intracranial abnormality. Electronically Signed   By: Morene Hoard M.D.   On: 12/08/2023 23:55   CT Head Wo Contrast Result Date: 12/08/2023 CLINICAL DATA:  Mental status change. Fatigue with confusion and headache. Elevated blood pressure in triage. EXAM: CT HEAD WITHOUT CONTRAST TECHNIQUE: Contiguous axial images were obtained from the base of the skull through the vertex without intravenous contrast. RADIATION DOSE REDUCTION: This exam was performed according to the departmental dose-optimization program which includes automated exposure control, adjustment of the mA and/or kV according to patient size and/or use of iterative reconstruction technique. COMPARISON:  None Available. FINDINGS: Brain: There is focus of acute hemorrhage over the superior aspect of the left thalamus and extending into the left lateral ventricle. This measures approximately 2.9 x 1.2 cm in AP and transverse dimension over the superior aspect of the left thalamus. There is mild local mass effect. No midline shift. Ventricles are otherwise normal and size. Remaining cisterns and CSF spaces are unremarkable. Vascular: No hyperdense vessel or unexpected calcification. Skull: Normal. Negative for fracture or focal lesion. Sinuses/Orbits: Mild chronic inflammatory change over the sinuses most notable over the left maxillary sinus. Visualized orbits  are unremarkable. Mastoid air cells are clear. Other: None. IMPRESSION: 1. Acute hemorrhage over the superior aspect of the left thalamus and extending into the left lateral ventricle likely hypertensive in etiology. Critical Value/emergent results were called by telephone at the time of interpretation on 12/08/2023 at 4:09 pm  to provider [facility] , who verbally acknowledged these results. Electronically Signed   By: Toribio Agreste M.D.   On: 12/08/2023 16:09   DG Chest Portable 1 View Result Date: 12/08/2023 EXAM: 1 VIEW XRAY OF THE CHEST 12/08/2023 03:24:00 PM COMPARISON: 03/09/2021 CLINICAL HISTORY: AMS. AMS AMS. AMS FINDINGS: LUNGS AND PLEURA: No focal pulmonary opacity. No pulmonary edema. No pleural effusion. No pneumothorax. HEART AND MEDIASTINUM: No acute abnormality of the cardiac and mediastinal silhouettes. BONES AND SOFT TISSUES: No acute osseous abnormality. IMPRESSION: 1. No acute process. Electronically signed by: Katheleen Faes MD 12/08/2023 03:37 PM EDT RP Workstation: [id number]       HISTORY OF PRESENT ILLNESS 43 y.o. patient with history of  HTN, DM, HLD, and CKD noncompliance with medications presenting with altered mental status and confusion. On arrival to the ED his blood pressure was 204/156.  Reports some word finding difficulty.  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  ICH score 1  HOSPITAL COURSE Intracerebral Hemorrhage and IVH:  left thalamus  Etiology: Hypertensive and uncontrolled risk factors CT head Acute hemorrhage over the superior aspect of the left thalamus and extending into the left lateral ventricle likely hypertensive in etiology.  Repeat CTH Stable  CTA head & neck No LVO  MRI  3.3 cm left posterior corona radiata hemorrhage  2D Echo EF 60 to 65%.  Moderate LVH HgbA1c 6.1 UDS negative No antithrombotic prior to admission, continue No antithrombotic due to acute ICH  Therapy recommendations:  outpatient PT/OT cognitive rehab    Hypertensive Emergency Home meds: Norvasc , Hyzaar and clonidine -noncompliant with medication UnStable On Cleviprex  drip at mg, triglycerides 405--wean as tolerated Labetalol  IV PRN 20mg  Q2H added, can increase to 40mg  if needed Continue home meds back, changed clonidine  to  Avapro  300 mg Continue hydralazine  50 mg--> 75mg  every 8 hours Blood Pressure Goal: SBP less than 160    Hyperlipidemia Home meds: Crestor  5 mg, not resumed in hospital LDL 98 goal < 70 Consider increasing to 20 at discharge   Hypokalemia K3.2, will replace BMP 8/12 AM    Other Stroke Risk Factors ETOH use, advised to drink no more than 2 drink(s) a day   Other Active Problems CKD-creatinine 1.54   DISCHARGE EXAM  PHYSICAL EXAM General:  Alert, mildly obese middle-aged African-American male in no acute distress Psych:  Mood and affect appropriate for situation CV: Regular rate and rhythm on monitor Respiratory:  Regular, unlabored respirations on room air GI: Abdomen soft and nontender     NEURO:  Mental Status: AA&Ox3, patient is able to give clear and coherent history Speech/Language: Speech is without dysarthria, but continues to have word finding issues and aphasia. Impaired memory and recall.    Cranial Nerves:  II: PERRL. Visual fields full.  III, IV, VI: EOMI. Eyelids elevate symmetrically.  V: Sensation is intact to light touch and symmetrical to face.  VII: Face is symmetrical resting and smiling VIII: hearing intact to voice. IX, X: Palate elevates symmetrically. Phonation is normal.  KP:Dynloizm shrug 5/5. XII: tongue is midline without fasciculations. Motor: 5/5 strength to all muscle groups tested.  Tone: is normal and bulk is normal Sensation- Intact  to light touch bilaterally. Extinction absent to light touch to DSS.   Coordination: FTN intact bilaterally, HKS: no ataxia in BLE.No drift.  Gait- deferred   Most Recent NIH 1  Discharge Diet       Diet   Diet Heart Room service appropriate? Yes with Assist; Fluid consistency: Thin   liquids  DISCHARGE PLAN Disposition: Home No antithrombotic due to acute ICH  Ongoing stroke risk factor control by Primary Care Physician at time of discharge Follow-up PCP [facility] in 5-7 days after  discharge. Please call number provided to make appointment  Follow-up in [facility] in 8 weeks, office to schedule an appointment.  Able to see NP in clinic.  50 minutes were spent preparing discharge.   Karna Geralds DNP, ACNPC-AG  [facility]  I have personally obtained history,examined this patient, reviewed notes, independently viewed imaging studies, participated in medical decision making and plan of care.ROS completed by me personally and pertinent positives fully documented  I have made any additions or clarifications directly to the above note. Agree with note above.    Eather Popp, MD Medical Director [facility] Pager: [phone] 12/13/2023 5:14 PM

## 2023-12-13 NOTE — Plan of Care (Signed)
 Problem: Education: Goal: Knowledge of General Education information will improve Description: Including pain rating scale, medication(s)/side effects and non-pharmacologic comfort measures 12/13/2023 0419 by Jori Roderic CROME, RN Outcome: Progressing 12/12/2023 2208 by Jori Roderic CROME, RN Outcome: Progressing   Problem: Health Behavior/Discharge Planning: Goal: Ability to manage health-related needs will improve 12/13/2023 0419 by Jori Roderic CROME, RN Outcome: Progressing 12/12/2023 2208 by Jori Roderic CROME, RN Outcome: Progressing   Problem: Clinical Measurements: Goal: Ability to maintain clinical measurements within normal limits will improve 12/13/2023 0419 by Jori Roderic CROME, RN Outcome: Progressing 12/12/2023 2208 by Jori Roderic CROME, RN Outcome: Progressing Goal: Will remain free from infection 12/13/2023 0419 by Jori Roderic CROME, RN Outcome: Progressing 12/12/2023 2208 by Jori Roderic CROME, RN Outcome: Progressing Goal: Diagnostic test results will improve 12/13/2023 0419 by Jori Roderic CROME, RN Outcome: Progressing 12/12/2023 2208 by Jori Roderic CROME, RN Outcome: Progressing Goal: Respiratory complications will improve 12/13/2023 0419 by Jori Roderic CROME, RN Outcome: Progressing 12/12/2023 2208 by Jori Roderic CROME, RN Outcome: Progressing Goal: Cardiovascular complication will be avoided 12/13/2023 0419 by Jori Roderic CROME, RN Outcome: Progressing 12/12/2023 2208 by Jori Roderic CROME, RN Outcome: Progressing   Problem: Activity: Goal: Risk for activity intolerance will decrease 12/13/2023 0419 by Jori Roderic CROME, RN Outcome: Progressing 12/12/2023 2208 by Jori Roderic CROME, RN Outcome: Progressing   Problem: Nutrition: Goal: Adequate nutrition will be maintained 12/13/2023 0419 by Jori Roderic CROME, RN Outcome: Progressing 12/12/2023 2208 by Jori Roderic CROME, RN Outcome: Progressing   Problem:  Coping: Goal: Level of anxiety will decrease 12/13/2023 0419 by Jori Roderic CROME, RN Outcome: Progressing 12/12/2023 2208 by Jori Roderic CROME, RN Outcome: Progressing   Problem: Elimination: Goal: Will not experience complications related to bowel motility 12/13/2023 0419 by Jori Roderic CROME, RN Outcome: Progressing 12/12/2023 2208 by Jori Roderic CROME, RN Outcome: Progressing Goal: Will not experience complications related to urinary retention 12/13/2023 0419 by Jori Roderic CROME, RN Outcome: Progressing 12/12/2023 2208 by Jori Roderic CROME, RN Outcome: Progressing   Problem: Pain Managment: Goal: General experience of comfort will improve and/or be controlled 12/13/2023 0419 by Jori Roderic CROME, RN Outcome: Progressing 12/12/2023 2208 by Jori Roderic CROME, RN Outcome: Progressing   Problem: Safety: Goal: Ability to remain free from injury will improve 12/13/2023 0419 by Jori Roderic CROME, RN Outcome: Progressing 12/12/2023 2208 by Jori Roderic CROME, RN Outcome: Progressing   Problem: Skin Integrity: Goal: Risk for impaired skin integrity will decrease 12/13/2023 0419 by Jori Roderic CROME, RN Outcome: Progressing 12/12/2023 2208 by Jori Roderic CROME, RN Outcome: Progressing   Problem: Education: Goal: Knowledge of disease or condition will improve 12/13/2023 0419 by Jori Roderic CROME, RN Outcome: Progressing 12/12/2023 2208 by Jori Roderic CROME, RN Outcome: Progressing Goal: Knowledge of secondary prevention will improve (MUST DOCUMENT ALL) 12/13/2023 0419 by Jori Roderic CROME, RN Outcome: Progressing 12/12/2023 2208 by Jori Roderic CROME, RN Outcome: Progressing Goal: Knowledge of patient specific risk factors will improve (DELETE if not current risk factor) 12/13/2023 0419 by Jori Roderic CROME, RN Outcome: Progressing 12/12/2023 2208 by Jori Roderic CROME, RN Outcome: Progressing   Problem: Intracerebral Hemorrhage  Tissue Perfusion: Goal: Complications of Intracerebral Hemorrhage will be minimized 12/13/2023 0419 by Jori Roderic CROME, RN Outcome: Progressing 12/12/2023 2208 by Jori Roderic CROME, RN Outcome: Progressing   Problem: Coping: Goal: Will verbalize positive feelings about self 12/13/2023 0419 by Jori Roderic CROME, RN Outcome: Progressing 12/12/2023 2208 by Jori Roderic CROME, RN Outcome: Progressing Goal: Will identify appropriate support needs 12/13/2023  [id number] by Jori Roderic CROME, RN Outcome: Progressing 12/12/2023 2208 by Jori Roderic CROME, RN Outcome: Progressing   Problem: Health Behavior/Discharge Planning: Goal: Ability to manage health-related needs will improve 12/13/2023 0419 by Jori Roderic CROME, RN Outcome: Progressing 12/12/2023 2208 by Jori Roderic CROME, RN Outcome: Progressing Goal: Goals will be collaboratively established with patient/family 12/13/2023 0419 by Jori Roderic CROME, RN Outcome: Progressing 12/12/2023 2208 by Jori Roderic CROME, RN Outcome: Progressing   Problem: Self-Care: Goal: Ability to participate in self-care as condition permits will improve 12/13/2023 0419 by Jori Roderic CROME, RN Outcome: Progressing 12/12/2023 2208 by Jori Roderic CROME, RN Outcome: Progressing Goal: Verbalization of feelings and concerns over difficulty with self-care will improve 12/13/2023 0419 by Jori Roderic CROME, RN Outcome: Progressing 12/12/2023 2208 by Jori Roderic CROME, RN Outcome: Progressing Goal: Ability to communicate needs accurately will improve 12/13/2023 0419 by Jori Roderic CROME, RN Outcome: Progressing 12/12/2023 2208 by Jori Roderic CROME, RN Outcome: Progressing   Problem: Nutrition: Goal: Risk of aspiration will decrease 12/13/2023 0419 by Jori Roderic CROME, RN Outcome: Progressing 12/12/2023 2208 by Jori Roderic CROME, RN Outcome: Progressing Goal: Dietary intake will improve 12/13/2023 0419 by Jori Roderic CROME, RN Outcome: Progressing 12/12/2023 2208 by Jori Roderic CROME, RN Outcome: Progressing

## 2024-01-05 ENCOUNTER — Other Ambulatory Visit (HOSPITAL_COMMUNITY): Payer: Self-pay

## 2024-01-07 NOTE — Progress Notes (Unsigned)
 PATIENT: Charles Higgins DOB: 04/12/1981  REASON FOR VISIT: follow up HISTORY FROM: patient PRIMARY NEUROLOGIST: Dr. Rosemarie  No chief complaint on file.    HISTORY OF PRESENT ILLNESS: Today   Charles Higgins is a 43 y.o. male who has been followed in this office for ***. Returns today for follow-up.   Imaging:   CTA head/neck: IMPRESSION: Negative CTA of the head and neck. No large vessel occlusion, hemodynamically significant stenosis, or other acute vascular abnormality. No vascular abnormality seen underlying the left thalamic hemorrhage.  MRI brain: IMPRESSION: 1. 3.3 cm left posterior corona radiata hemorrhage, stable compared to prior CT head without contrast on 12/08/23. 2. No hydrocephalus, underlying mass lesion, or pathologic enhancement. 3. Moderately advanced periventricular and scattered subcortical T2 hyperintensities for age. 4. Mild mucosal thickening in the left frontal sinus, left greater than right ethmoid air cells, and left maxillary sinus without fluid levels.   FU: Labs: Carotid dopplers? ECHO: Discharge note Work? OSA?   HISTORY (copied from Hospital)Mr. Charles Higgins is a 43 y.o. male with history of HTN, DM, HLD, and CKD noncompliance with medications presenting with altered mental status and confusion. On arrival to the ED his blood pressure was 204/156.  Reports some word finding difficulty.  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  ICH score 1   Intracerebral Hemorrhage and IVH:  left thalamus  Etiology: Hypertensive and uncontrolled risk factors CT head Acute hemorrhage over the superior aspect of the left thalamus and extending into the left lateral ventricle likely hypertensive in etiology.  Repeat CTH Stable  CTA head & neck No LVO  MRI  3.3 cm left posterior corona radiata hemorrhage  2D Echo EF 60 to 65%.  Moderate LVH LDL 98 HgbA1c 6.1 UDS negative VTE  prophylaxis -SCDs No antithrombotic prior to admission, continue No antithrombotic due to acute ICH  Therapy recommendations:  No PT follow-up, possibly outpatient cognitive rehab Disposition: Pending, likely home   Hypertensive Emergency Home meds: Norvasc , Hyzaar and clonidine -noncompliant with medication UnStable On Cleviprex  drip at mg, triglycerides 405--wean as tolerated Labetalol  IV PRN 20mg  Q2H added, can increase to 40mg  if needed Continue home meds back, changed clonidine  to Avapro  300 mg Continue hydralazine  50 mg--> 75mg  every 8 hours Blood Pressure Goal: SBP less than 160    Hyperlipidemia Home meds: Crestor  5 mg, not resumed in hospital LDL 98 goal < 70 Consider increasing to 20 at discharge   Hypokalemia K3.2, will replace BMP 8/12 AM    Other Stroke Risk Factors ETOH use, advised to drink no more than 2 drink(s) a day   Other Active Problems CKD-creatinine 1.54  REVIEW OF SYSTEMS: Out of a complete 14 system review of symptoms, the patient complains only of the following symptoms, and all other reviewed systems are negative.  ALLERGIES: No Known Allergies  HOME MEDICATIONS: Outpatient Medications Prior to Visit  Medication Sig Dispense Refill   amLODipine  (NORVASC ) 10 MG tablet Take 1 tablet (10 mg total) by mouth daily. 30 tablet 0   atorvastatin  (LIPITOR) 40 MG tablet Take 1 tablet (40 mg total) by mouth daily. 30 tablet 0   butalbital -acetaminophen -caffeine  (FIORICET ) 50-325-40 MG tablet Take 2 tablets by mouth every 6 (six) hours as needed for headache. 14 tablet 0   cloNIDine  (CATAPRES ) 0.1 MG tablet Take 1 tablet (0.1 mg total) by mouth 2 (two) times daily. 60 tablet 0   losartan -hydrochlorothiazide  (HYZAAR) 100-25 MG tablet Take 1 tablet  by mouth daily. 30 tablet 0   ondansetron  (ZOFRAN -ODT) 4 MG disintegrating tablet Take 1 tablet (4 mg total) by mouth every 8 (eight) hours as needed for nausea or vomiting. 10 tablet 0   No facility-administered  medications prior to visit.    PAST MEDICAL HISTORY: Past Medical History:  Diagnosis Date   Controlled diabetes mellitus type II without complication (HCC) 08/25/2013   Elevated serum creatinine 08/25/2013   Hyperlipidemia 06/23/2011   Hypertension 03/10/2011   Metabolic syndrome 06/28/2015   Umbilical hernia 06/23/2011    PAST SURGICAL HISTORY: Past Surgical History:  Procedure Laterality Date   KNEE SURGERY     High school   NO PAST SURGERIES      FAMILY HISTORY: Family History  Problem Relation Age of Onset   Diabetes Father    Hypertension Father    Kidney failure Father    CAD Father    Hypertension Mother    Diabetes Maternal Grandmother     SOCIAL HISTORY: Social History   Socioeconomic History   Marital status: Married    Spouse name: Not on file   Number of children: Not on file   Years of education: Not on file   Highest education level: Not on file  Occupational History   Occupation: Insurance    Comment: Community education officer  Tobacco Use   Smoking status: Never   Smokeless tobacco: Never  Vaping Use   Vaping status: Never Used  Substance and Sexual Activity   Alcohol use: No   Drug use: No   Sexual activity: Yes  Other Topics Concern   Not on file  Social History Narrative   Has one 52 year old son and one on the way.   Social Drivers of Corporate investment banker Strain: Not on file  Food Insecurity: Patient Declined (12/10/2023)   Hunger Vital Sign    Worried About Running Out of Food in the Last Year: Patient declined    Ran Out of Food in the Last Year: Patient declined  Transportation Needs: Patient Declined (12/10/2023)   PRAPARE - Administrator, Civil Service (Medical): Patient declined    Lack of Transportation (Non-Medical): Patient declined  Physical Activity: Not on file  Stress: Not on file  Social Connections: Not on file  Intimate Partner Violence: Patient Declined (12/10/2023)   Humiliation, Afraid, Rape, and Kick questionnaire     Fear of Current or Ex-Partner: Patient declined    Emotionally Abused: Patient declined    Physically Abused: Patient declined    Sexually Abused: Patient declined      PHYSICAL EXAM  There were no vitals filed for this visit. There is no height or weight on file to calculate BMI.  Generalized: Well developed, in no acute distress   Neurological examination  Mentation: Alert oriented to time, place, history taking. Follows all commands speech and language fluent Cranial nerve II-XII: Pupils were equal round reactive to light. Extraocular movements were full, visual field were full on confrontational test. Facial sensation and strength were normal. Uvula tongue midline. Head turning and shoulder shrug  were normal and symmetric. Motor: The motor testing reveals 5 over 5 strength of all 4 extremities. Good symmetric motor tone is noted throughout.  Sensory: Sensory testing is intact to soft touch on all 4 extremities. No evidence of extinction is noted.  Coordination: Cerebellar testing reveals good finger-nose-finger and heel-to-shin bilaterally.  Gait and station: Gait is normal. Tandem gait is normal. Romberg is negative. No drift  is seen.  Reflexes: Deep tendon reflexes are symmetric and normal bilaterally.   DIAGNOSTIC DATA (LABS, IMAGING, TESTING) - I reviewed patient records, labs, notes, testing and imaging myself where available.  Lab Results  Component Value Date   WBC 8.1 12/11/2023   HGB 15.7 12/11/2023   HCT 48.2 12/11/2023   MCV 80.7 12/11/2023   PLT 228 12/11/2023      Component Value Date/Time   NA 137 12/12/2023 0639   NA 140 01/31/2019 0728   K 3.2 (L) 12/12/2023 0639   CL 98 12/12/2023 0639   CO2 27 12/12/2023 0639   GLUCOSE 118 (H) 12/12/2023 0639   BUN 23 (H) 12/12/2023 0639   BUN 16 01/31/2019 0728   CREATININE 1.83 (H) 12/12/2023 0639   CREATININE 1.33 (H) 03/22/2022 0917   CALCIUM  9.2 12/12/2023 0639   PROT 7.4 12/10/2023 0827   ALBUMIN 3.9  12/10/2023 0827   AST 24 12/10/2023 0827   ALT 21 12/10/2023 0827   ALKPHOS 61 12/10/2023 0827   BILITOT 0.9 12/10/2023 0827   GFRNONAA 46 (L) 12/12/2023 0639   GFRNONAA 62 11/08/2018 1308   GFRAA 81 01/31/2019 0728   GFRAA 72 11/08/2018 1308   Lab Results  Component Value Date   CHOL 240 (H) 12/10/2023   HDL 38 (L) 12/10/2023   LDLCALC UNABLE TO CALCULATE IF TRIGLYCERIDE OVER 400 mg/dL 91/89/7974   LDLDIRECT 183 (H) 12/10/2023   TRIG 405 (H) 12/10/2023   CHOLHDL 6.3 12/10/2023   Lab Results  Component Value Date   HGBA1C 5.1 12/09/2023   No results found for: VITAMINB12 No results found for: TSH    ASSESSMENT AND PLAN 43 y.o. year old male  has a past medical history of Controlled diabetes mellitus type II without complication (HCC) (08/25/2013), Elevated serum creatinine (08/25/2013), Hyperlipidemia (06/23/2011), Hypertension (03/10/2011), Metabolic syndrome (06/28/2015), and Umbilical hernia (06/23/2011). here with ***     Continue {anticoagulants:31417}  and ***  for secondary stroke prevention.   Discussed secondary stroke prevention measures and importance of close PCP follow up for aggressive stroke risk factor management. I have gone over the pathophysiology of stroke, warning signs and symptoms, risk factors and their management in some detail with instructions to go to the closest emergency room for symptoms of concern. HTN: BP goal <130/90.  Stable on *** per PCP HLD: LDL goal <70. Recent LDL ***.  DMII: A1c goal<7.0. Recent A1c ***.  Encouraged patient to monitor diet and encouraged exercise FU with our office ***  No orders of the defined types were placed in this encounter.  No orders of the defined types were placed in this encounter.     Duwaine Russell, MSN, NP-C 01/07/2024, 4:24 PM [facility] [address], [address] [place], [place] [address] [phone]

## 2024-01-10 ENCOUNTER — Ambulatory Visit: Admitting: Adult Health

## 2024-01-10 ENCOUNTER — Encounter: Payer: Self-pay | Admitting: Adult Health

## 2024-01-10 VITALS — BP 127/92 | HR 68 | Ht 70.0 in | Wt 242.0 lb

## 2024-01-10 DIAGNOSIS — E785 Hyperlipidemia, unspecified: Secondary | ICD-10-CM

## 2024-01-10 DIAGNOSIS — I619 Nontraumatic intracerebral hemorrhage, unspecified: Secondary | ICD-10-CM

## 2024-01-10 DIAGNOSIS — R0683 Snoring: Secondary | ICD-10-CM | POA: Diagnosis not present

## 2024-01-10 DIAGNOSIS — R0681 Apnea, not elsewhere classified: Secondary | ICD-10-CM

## 2024-01-10 NOTE — Progress Notes (Signed)
 I agree with the above plan

## 2024-01-10 NOTE — Patient Instructions (Signed)
 Your Plan:   Blood pressure goal <130/90 Cholesterol LDL goal <70 Diabetes goal A1c <7 Monitor diet and try to exercise  Sleep study  Thank you for coming to see us  at [facility]. I hope we have been able to provide you high quality care today.  You may receive a patient satisfaction survey over the next few weeks. We would appreciate your feedback and comments so that we may continue to improve ourselves and the health of our patients.

## 2024-01-16 ENCOUNTER — Ambulatory Visit: Attending: Neurology | Admitting: Occupational Therapy

## 2024-01-16 ENCOUNTER — Other Ambulatory Visit: Payer: Self-pay

## 2024-01-16 ENCOUNTER — Encounter: Payer: Self-pay | Admitting: Occupational Therapy

## 2024-01-16 DIAGNOSIS — R278 Other lack of coordination: Secondary | ICD-10-CM | POA: Insufficient documentation

## 2024-01-16 DIAGNOSIS — M6281 Muscle weakness (generalized): Secondary | ICD-10-CM | POA: Insufficient documentation

## 2024-01-16 DIAGNOSIS — I69111 Memory deficit following nontraumatic intracerebral hemorrhage: Secondary | ICD-10-CM | POA: Diagnosis present

## 2024-01-16 DIAGNOSIS — I639 Cerebral infarction, unspecified: Secondary | ICD-10-CM | POA: Diagnosis not present

## 2024-01-16 DIAGNOSIS — R41844 Frontal lobe and executive function deficit: Secondary | ICD-10-CM | POA: Diagnosis present

## 2024-01-16 DIAGNOSIS — R29818 Other symptoms and signs involving the nervous system: Secondary | ICD-10-CM | POA: Diagnosis present

## 2024-01-16 NOTE — Therapy (Unsigned)
 OUTPATIENT OCCUPATIONAL THERAPY NEURO EVALUATION & TREATMENT  Patient Name: Charles Higgins MRN: [id number] DOB:[birth year], 43 y.o., male Today's Date: 01/16/2024  PCP: Kimberly Georgia, MD REFERRING PROVIDER: Rosemarie Eather RAMAN, MD  END OF SESSION:  OT End of Session - 01/16/24 1800     Visit Number 1    Number of Visits 10    Date for OT Re-Evaluation 03/29/24    Authorization Type BCBS [id number]    Authorization Time Period VL:? AUTH REQUIRED    OT Start Time 1532    OT Stop Time 1619    OT Time Calculation (min) 47 min    Equipment Utilized During Treatment Testing materials    Activity Tolerance Patient tolerated treatment well    Behavior During Therapy WFL for tasks assessed/performed          Past Medical History:  Diagnosis Date   Controlled diabetes mellitus type II without complication (HCC) 08/25/2013   Elevated serum creatinine 08/25/2013   Hyperlipidemia 06/23/2011   Hypertension 03/10/2011   Metabolic syndrome 06/28/2015   Umbilical hernia 06/23/2011   Past Surgical History:  Procedure Laterality Date   KNEE SURGERY     High school   NO PAST SURGERIES     Patient Active Problem List   Diagnosis Date Noted   Hypertensive emergency 12/13/2023   Hypokalemia 12/13/2023   Hemorrhagic stroke (HCC) 12/08/2023   Metabolic syndrome 06/28/2015   Controlled diabetes mellitus type II without complication (HCC) 08/25/2013   Elevated serum creatinine 08/25/2013   Hyperlipidemia 06/23/2011   Umbilical hernia 06/23/2011   Hypertension 03/10/2011    ONSET DATE: 12/13/2023  REFERRING DIAG:  Diagnosis  I63.9 (ICD-10-CM) - Cerebrovascular accident (CVA), unspecified mechanism (HCC)    THERAPY DIAG:  Muscle weakness (generalized)  Other lack of coordination  Frontal lobe and executive function deficit  Other symptoms and signs involving the nervous system  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 Pt has not noticed much change in himself however his wife has noted a few changes including weakness, memory decline, and frustration. Wife feels that he use to be more patient with the children but they are noticing him becoming easily frustrated. Wife feels that this is because of the stroke effecting the L side of the brain. Pt and wife noticed stroke after pt didn't know what to do for work while sitting at his computer. Pt is not able to recall work task or login information. 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 Pt feels that his memory has improved from 50% to 85%. However, wife still feels that he is only at 65%. Pt also reported that he notices himself becoming more tired after task. He reported that today he went to get an oil change, inspection, and picked up his wife's prescription and felt fatigued. Pt is also noticing challenges with driving. Reported that last week he drove to the mall and became overstimulated having to watch out for other cars. Pt has been out of work since the CVA but reported that he is scheduled to return to working at home on November 8th, 2025 after he meets with his MD.   Pt accompanied by: Wife   PERTINENT HISTORY:  83 y.o. patient with history  of  HTN, DM, HLD, and CKD noncompliance with medications presenting with altered mental status and confusion. On arrival to the ED his blood pressure was 204/156.  Reports some word finding difficulty.  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   PRECAUTIONS: Other: safety  WEIGHT BEARING RESTRICTIONS: No  PAIN:  Are you having pain? No  FALLS: Has patient fallen in last 6 months? No  LIVING ENVIRONMENT: Lives with: wife and three  children ages 78,7, and 41 Lives in: House Stairs: Yes: Internal: 15 steps to bedroom- removed rails; External: 1; threshold step; rails; none Has following equipment at home: built in shower seat  PLOF: Independent with basic ADLs, Independent with household mobility without device, Independent with community mobility without device, Independent with gait, Independent with transfers, and Leisure: prior to stroke, pt enjoyed bowling, going to the gym, and spending time with friends/family. Pt drives but is having difficulty due to being overstimulated. 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   PATIENT GOALS:   OBJECTIVE:  Note: Objective measures were completed at Evaluation unless otherwise noted.  HAND DOMINANCE: Right  ADLs: Overall ADLs: Independent Equipment: built in shower seat  IADLs: Shopping: Independent  Light housekeeping: able to help with laundry and other household task; hasn't cut grass due to low energy. Meal Prep: still enjoys cooking independently loves to make pulled pork & banana pudding Community mobility: drives himself, however recently became overstimulated while driving to the mall last week Medication management: has a hard time managing but is using timers on phone for reminders Financial management: Independent  Handwriting: Not tested ( will assess at next visit)   MOBILITY STATUS: Independent  POSTURE COMMENTS:  No Significant postural limitations Sitting balance: Moves/returns truncal midpoint >2 inches in all planes  ACTIVITY TOLERANCE: Activity tolerance: Since the stroke, patient has experienced low energy and activity tolerance. He is taking more naps throughout the day.   FUNCTIONAL OUTCOME MEASURES: PSFS Began exploring self identified areas of concern including  1. communication style/patience/processing with children- pt reports 5/10 ability/ spouse rates  3/10. 2. Work task-  patient/spouse reports 0/10 3. Driving- not rated at this time but self reported issues with overstimulation.  Trail making Test Test part A: 37.33 seconds; 1 self correct error Test part B: 53.61 seconds; 4 errors  UPPER EXTREMITY ROM:   All WFL  UPPER EXTREMITY MMT:    All WFL   HAND FUNCTION: Grip strength: Right: 73.1,80.0,81.1 lbs; Left: 70.9,81.5, 79.1 lbs Average: R( 78.1); L (77.2)  COORDINATION: 9 Hole Peg test: Right: 30.37 sec; Left: 35.89 sec (dropped 1)  SENSATION: WFL  EDEMA: None   COGNITION: Overall cognitive status: Impaired and easily frustrated; memory loss, unable to recall login information and job details; easily fatigued; limited insight into deficits; self identified word finding difficulty (but improving)   VISION: Subjective report: No concerns Baseline vision: No visual deficits Visual history: N/A  VISION ASSESSMENT: Not tested  Patient has difficulty with following activities due to following visual impairments: N/A  PRAXIS: Impaired: Ideation and Motor planning  OBSERVATIONS: Pt ambulated with no AE and no loss of balance. The pt appears well kept and groomed with no assistance from family.  Pt had decreased ability to express limitations but emerging awareness of health deficits. Pt able to express that he wants to be healthier.  TREATMENT DATE: 01/16/2024 - Self-care/home management completed for duration as noted below including:  OT educated pt on rehabilitation process and results of objective measures in relation to pt specific goals.   OT educated pt on coping strategies to prevent feeling overwhelmed or frustrated. Pt encouraged to communicate with his children that his brain is working on healing and at times he may need to step away to take a breather. He was also educated on memory  strategies such as continuing use of reminders and also using a planner/ journal.  Pt/wife verbalized understanding.   PATIENT EDUCATION: Education details: OT role and POC Considerations, coping,and memory strategies Person educated: Patient and Spouse Education method: Explanation, Demonstration, Tactile cues, and Verbal cues Education comprehension: verbalized understanding, verbal cues required, and needs further education  HOME EXERCISE PROGRAM: N/A for this visit    GOALS: Goals reviewed with patient? Yes  SHORT TERM GOALS: Target date: 02/13/2024  Patient will demonstrate independence with initial B UE/ full body HEP.  Baseline: New to outpt OT  Average Grip Strength: R( 78.1); L (77.2) ; 9 Hole Peg test: Right: 30.37 sec; Left: 35.89 sec (dropped 1) Goal status: INITIAL  2.  Patient will engage in a simulated bowling or light recreational activity for 15 minutes using at least 2 energy conservation strategies (e.g., pacing, seated positioning, rest breaks), with no more than minimal verbal cues, to improve activity tolerance for return to leisure roles. Baseline: Napping daily Goal status: INITIAL  3.  Patient will complete a simulated typing task for 10 minutes with sustained attention and greater than 85% accuracy to support returning to work task and recall of job details. Baseline: TBD Goal status: INITIAL  4.  Patient will follow a 3-step sequence task of entering data into a spreadsheet/ chart with no more than 2 verbal cues.  Baseline: 0/10 ability to perform job task at eval Goal status: INITIAL  5.  Patient will independently identify at least 3 coping strategies during daily routines to support family interactions and improve participation in meaningful occupations.  (such as deep breathing, self-talk, journaling, taking walks, scheduled rest breaks) Baseline: family/pt identify increased frustration at times due to overstimulation Goal status: INITIAL  6.   Patient will identify and use external memory aides (calendar, notes, digital reminders) to accurately recall and complete 1- 2 task at home with minimal prompting.  Baseline: limited recall of job task  Goal status: INITIAL  LONG TERM GOALS: Target date: 03/26/2024    Patient will sustain participation in a 30-minute simulated work task (typing, Retail banker, Counsellor documents) with fewer than 3 errors and no rest breaks to increase functional ability to return to work.  Baseline:  0/10 ability to perform job task at eval Goal status: INITIAL  2.  Patient will demonstrate improved cognitive endurance by completing a dual-task activity such as typing while being on the phone with no more than 1 cue. Baseline: unable  Goal status: INITIAL  3.  Patient will independently demonstrate and complete a 5-step mock work task with 100% accuracy and no cues. Baseline: 0/10 ability to perform job task at eval Goal status: INITIAL  4.  Patient will report at least two-point increase in average PSFS score or at least three-point increase in a single activity score indicating functionally significant improvement given minimum detectable change. Baseline: total score TBD  0/10 ability to perform job task at eval   Goal status: INITIAL  ASSESSMENT:  CLINICAL IMPRESSION:  Patient is a 42  y.o. male who was seen today for occupational therapy evaluation for CVA. Hx includes HTN, DM, HLD,  CKD and noncompliance with medications. Patient currently presents near baseline level of physical function demonstrating functional deficits and impairments in endurance. Pt presents below baseline function with cognition demonstrating functional deficits and impairments in memory, coping strategies, etc as noted below. Pt would benefit from skilled OT services in the outpatient setting to work on impairments as noted below to help pt return to PLOF as able.      PERFORMANCE DEFICITS: in functional skills  including ADLs, IADLs, coordination, dexterity, proprioception, and decreased knowledge of use of DME, cognitive skills including consciousness, emotional, energy/drive, memory, problem solving, and safety awareness, and psychosocial skills including coping strategies, environmental adaptation, habits, interpersonal interactions, and routines and behaviors.   IMPAIRMENTS: are limiting patient from ADLs, IADLs, rest and sleep, and work.   CO-MORBIDITIES: may have co-morbidities  that affects occupational performance. Patient will benefit from skilled OT to address above impairments and improve overall function.  MODIFICATION OR ASSISTANCE TO COMPLETE EVALUATION: Min-Moderate modification of tasks or assist with assess necessary to complete an evaluation.  OT OCCUPATIONAL PROFILE AND HISTORY: Detailed assessment: Review of records and additional review of physical, cognitive, psychosocial history related to current functional performance.  CLINICAL DECISION MAKING: Moderate - several treatment options, min-mod task modification necessary  REHAB POTENTIAL: Good  EVALUATION COMPLEXITY: Moderate    PLAN:  OT FREQUENCY: 1-2x/week  OT DURATION: 10 weeks  PLANNED INTERVENTIONS: 97168 OT Re-evaluation, 97535 self care/ADL training, 02889 therapeutic exercise, 97530 therapeutic activity, 97112 neuromuscular re-education, [id number] Cognitive training (first 15 min), 02869 Cognitive training(each additional 15 min), balance training, functional mobility training, visual/perceptual remediation/compensation, psychosocial skills training, energy conservation, coping strategies training, patient/family education, and DME and/or AE instructions  RECOMMENDED OTHER SERVICES: May need recommendations for speech therapy for memory/cognition  CONSULTED AND AGREED WITH PLAN OF CARE: Patient and family member/caregiver  PLAN FOR NEXT SESSION: Review goals  Initiate HEP Energy conservation/coping  strategies Multi-tasking activities/ work simulation Assess handwriting ( possible skill needed for work)   [employer], Student-OT 01/16/2024, 6:09 PM

## 2024-01-18 ENCOUNTER — Telehealth: Payer: Self-pay | Admitting: *Deleted

## 2024-01-18 NOTE — Telephone Encounter (Addendum)
 Received forms at desk to complete.  Checked with MM/NP to complete.  Please call to get more information.  Who completed for him before?  Pcp??  Who advised him to stop working?   LMVM for pt to return call.

## 2024-01-22 ENCOUNTER — Telehealth: Payer: Self-pay | Admitting: Adult Health

## 2024-01-22 NOTE — Telephone Encounter (Signed)
 LVM for pt to call back to schedule   [employer] auth: [id number] (exp. 01/17/24 to 04/15/24)

## 2024-01-22 NOTE — Telephone Encounter (Signed)
 I called pt relayed that MM/NP wanted me to check on this form that we received from [facility].  Pt stated that his PCP put him out of work and he is out till 03/2024.  He states he thinks his pcp filled out, no need for us  to do.  Pt has not paid for this form. He appreciated follow up.

## 2024-01-26 ENCOUNTER — Ambulatory Visit: Admitting: Occupational Therapy

## 2024-01-26 DIAGNOSIS — R29818 Other symptoms and signs involving the nervous system: Secondary | ICD-10-CM

## 2024-01-26 DIAGNOSIS — R278 Other lack of coordination: Secondary | ICD-10-CM

## 2024-01-26 DIAGNOSIS — M6281 Muscle weakness (generalized): Secondary | ICD-10-CM | POA: Diagnosis not present

## 2024-01-26 DIAGNOSIS — I69111 Memory deficit following nontraumatic intracerebral hemorrhage: Secondary | ICD-10-CM

## 2024-01-26 NOTE — Therapy (Signed)
 OUTPATIENT OCCUPATIONAL THERAPY NEURO TREATMENT  Patient Name: DEANDRE BRANNAN MRN: [id number] DOB:04/17/1981, 43 y.o., male Today's Date: 01/26/2024  PCP: Kimberly Georgia, MD REFERRING PROVIDER: Rosemarie Eather RAMAN, MD  END OF SESSION:  OT End of Session - 01/26/24 1328     Visit Number 2    Number of Visits 10    Date for Recertification  03/29/24    Authorization Type [employer]BS [id number]    Authorization Time Period VL:? AUTH REQUIRED    OT Start Time 1233    OT Stop Time 1319    OT Time Calculation (min) 46 min    Equipment Utilized During Treatment Computer    Activity Tolerance Patient tolerated treatment well    Behavior During Therapy WFL for tasks assessed/performed           Past Medical History:  Diagnosis Date   Controlled diabetes mellitus type II without complication (HCC) 08/25/2013   Elevated serum creatinine 08/25/2013   Hyperlipidemia 06/23/2011   Hypertension 03/10/2011   Metabolic syndrome 06/28/2015   Umbilical hernia 06/23/2011   Past Surgical History:  Procedure Laterality Date   KNEE SURGERY     High school   NO PAST SURGERIES     Patient Active Problem List   Diagnosis Date Noted   Hypertensive emergency 12/13/2023   Hypokalemia 12/13/2023   Hemorrhagic stroke (HCC) 12/08/2023   Metabolic syndrome 06/28/2015   Controlled diabetes mellitus type II without complication (HCC) 08/25/2013   Elevated serum creatinine 08/25/2013   Hyperlipidemia 06/23/2011   Umbilical hernia 06/23/2011   Hypertension 03/10/2011    ONSET DATE: 12/13/2023  REFERRING DIAG:  Diagnosis  I63.9 (ICD-10-CM) - Cerebrovascular accident (CVA), unspecified mechanism (HCC)    THERAPY DIAG:  Muscle weakness (generalized)  Other lack of coordination  Other symptoms and signs involving the nervous system  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 He has been doing word puzzle books and writing things down for better recall and processing of information. Pt also reported that he has been doing well with getting rest.  Pt accompanied by: Self   PERTINENT HISTORY:  43 y.o. patient with history of  HTN, DM, HLD, and CKD noncompliance with medications presenting with altered mental status and confusion. On arrival to the ED his blood pressure was 204/156.  Reports some word finding difficulty.  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   PRECAUTIONS: Other: safety  WEIGHT BEARING RESTRICTIONS: No  PAIN:  Are you having pain? No  FALLS: Has patient fallen in last 6 months? No  LIVING ENVIRONMENT: Lives with: wife and three children ages 62,7, and 66 Lives in: House Stairs: Yes: Internal: 15 steps to bedroom- removed rails; External: 1; threshold step; rails; none Has following equipment at home: built in shower seat  PLOF: Independent with basic ADLs, Independent with household mobility without device, Independent with community mobility without device, Independent with gait, Independent with transfers, and Leisure: prior to stroke, pt enjoyed bowling, going to the gym, and spending time with friends/family. Pt drives but is having difficulty due to being overstimulated. 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   PATIENT GOALS:   OBJECTIVE:  Note: Objective measures were  completed at Evaluation unless otherwise noted.  HAND DOMINANCE: Right  ADLs: Overall ADLs: Independent Equipment: built in shower seat  IADLs: Shopping: Independent  Light housekeeping: able to help with laundry and other household task; hasn't cut grass due to low energy. Meal Prep: still enjoys cooking  independently loves to make pulled pork & banana pudding Community mobility: drives himself, however recently became overstimulated while driving to the mall last week Medication management: has a hard time managing but is using timers on phone for reminders Financial management: Independent  Handwriting: Not tested ( will assess at next visit)   MOBILITY STATUS: Independent  POSTURE COMMENTS:  No Significant postural limitations Sitting balance: Moves/returns truncal midpoint >2 inches in all planes  ACTIVITY TOLERANCE: Activity tolerance: Since the stroke, patient has experienced low energy and activity tolerance. He is taking more naps throughout the day.   FUNCTIONAL OUTCOME MEASURES: PSFS Began exploring self identified areas of concern including  1. communication style/patience/processing with children- pt reports 5/10 ability/ spouse rates 3/10. 2. Work task-  patient/spouse reports 0/10 3. Driving- not rated at this time but self reported issues with overstimulation.  Trail making Test Test part A: 37.33 seconds; 1 self correct error Test part B: 53.61 seconds; 4 errors  UPPER EXTREMITY ROM:   All WFL  UPPER EXTREMITY MMT:    All WFL   HAND FUNCTION: Grip strength: Right: 73.1,80.0,81.1 lbs; Left: 70.9,81.5, 79.1 lbs Average: R( 78.1); L (77.2)  COORDINATION: 9 Hole Peg test: Right: 30.37 sec; Left: 35.89 sec (dropped 1)  SENSATION: WFL  EDEMA: None   COGNITION: Overall cognitive status: Impaired and easily frustrated; memory loss, unable to recall login information and job details; easily fatigued; limited insight into deficits; self identified word finding difficulty (but improving)   VISION: Subjective report: No concerns Baseline vision: No visual deficits Visual history: N/A  VISION ASSESSMENT: Not tested  Patient has difficulty with following activities due to following visual impairments: N/A  PRAXIS: Impaired: Ideation and Motor  planning  OBSERVATIONS: Pt ambulated with no AE and no loss of balance. The pt appears well kept and groomed with no assistance from family.  Pt had decreased ability to express limitations but emerging awareness of health deficits. Pt able to express that he wants to be healthier.                                                                                                                              TREATMENT DATE:  - Self-care/home management completed for duration as noted below including:  OT reviewed with pt the rehabilitation process and results of objective measures in relation to pt specific goals. Pt made note of goals by writing them down.   OT educated pt on energy conservation techniques to help with healing of brain post stroke and to improve activity tolerance for return to leisure roles.Strategies included the following; PRIORITY: Decide what tasks are most important and when to complete them.  Prioritize urgent tasks over less critical ones.   Grocery shopping first, then come home rest do something light like washing dishes PLAN: Plan your activities in advance to avoid extra trips and gather necessary supplies. This helps in managing the workload and conserving energy.   Example, cooking get all of your ingredients and needed equipment out in one area to avoid unnecessary trips or extra moving back and fourth  PACE: Take your time and avoid rushing. Pace yourself to prevent exhaustion and maintain energy levels.   Do a task, take a break, come back to it later POSITION: Adjust your body position to reduce strain. Sit when possible, and use comfortable tools or equipment to minimize physical exertion. Always be aware of your body position during task, example folding clothes, its okay to have a seat instead of standing and tiring yourself out  - Therapeutic activities completed for duration as noted below including:  Pt engaged in a card game to focus on memory recall. Pt was  instructed to flip cards face down and pick them up one at a time to match cards with its pair. Pt was able to complete this task with no cueing and demonstrated ability to recall card placement.    Pt then worked on a computer task including typing test and games to help with improving recall of job task. Pt was able to recall part of his job task throughout and made note of them in his notebook verbalizing that this was his first time since the stroke being able to recall more of what he did at work.  Pt was educated on ways to continue with memory recall for work such as each day writing things down as they come back to mind and adding to it to help with improved ability for return to work. Pt verbalized understanding. He also mentioned that he has not done much typing since the stroke but does have a computer at home. He was educated on typing. Com and is willing to try at home.   Pt also engaged in a visual scanning activity that  required him to visually scan and identify colored pins hidden throughout the therapy gym. Pins were intentionally camouflaged within the environment, placed at varying heights (at, above, and below eye level) to encourage thorough environmental scanning and attention to detail for memory recall. Pt was able to locate pins with minimal verbal cueing for recall accuracy. It was also noted that pt used strategies for recall such as "ball. I remember there was a blue pin near a ball".    PATIENT EDUCATION: Education details: Goals, energy conservation, [email or web address], memory training (writing things down, card games, puzzles)  Person educated: Patient Education method: Explanation, Demonstration, Tactile cues, Verbal cues, and Handouts Education comprehension: verbalized understanding, verbal cues required, and needs further education  HOME EXERCISE PROGRAM: 01/26/2024: Energy conservation handout, memory card game   GOALS: Goals reviewed with patient? Yes  SHORT TERM  GOALS: Target date: 02/13/2024  Patient will demonstrate independence with initial B UE/ full body HEP.  Baseline: New to outpt OT  Average Grip Strength: R( 78.1); L (77.2) ; 9 Hole Peg test: Right: 30.37 sec; Left: 35.89 sec (dropped 1) Goal status: IN PROGRESS  2.  Patient will engage in a simulated bowling or light recreational activity for 15 minutes using at least 2 energy conservation strategies (e.g., pacing, seated positioning, rest breaks), with no more than minimal verbal cues, to improve activity tolerance for return to leisure roles. Baseline:  Napping daily Goal status: IN PROGRESS  3.  Patient will complete a simulated typing task for 10 minutes with sustained attention and greater than 85% accuracy to support returning to work task and recall of job details. Baseline: TBD Goal status: IN PROGRESS  4.  Patient will follow a 3-step sequence task of entering data into a spreadsheet/ chart with no more than 2 verbal cues.  Baseline: 0/10 ability to perform job task at eval Goal status: IN PROGRESS  5.  Patient will independently identify at least 3 coping strategies during daily routines to support family interactions and improve participation in meaningful occupations.  (such as deep breathing, self-talk, journaling, taking walks, scheduled rest breaks) Baseline: family/pt identify increased frustration at times due to overstimulation Goal status: IN PROGRESS 6.  Patient will identify and use external memory aides (calendar, notes, digital reminders) to accurately recall and complete 1- 2 task at home with minimal prompting.  Baseline: limited recall of job task  Goal status: IN PROGRESS  LONG TERM GOALS: Target date: 03/29/2024    Patient will sustain participation in a 30-minute simulated work task (typing, Retail banker, Counsellor documents) with fewer than 3 errors and no rest breaks to increase functional ability to return to work.  Baseline:  0/10 ability to  perform job task at eval Goal status: IN PROGRESS  2.  Patient will demonstrate improved cognitive endurance by completing a dual-task activity such as typing while being on the phone with no more than 1 cue. Baseline: unable  Goal status: IN PROGRESS  3.  Patient will independently demonstrate and complete a 5-step mock work task with 100% accuracy and no cues. Baseline: 0/10 ability to perform job task at eval Goal status: IN PROGRESS  4.  Patient will report at least two-point increase in average PSFS score or at least three-point increase in a single activity score indicating functionally significant improvement given minimum detectable change. Baseline: total score TBD  0/10 ability to perform job task at eval   Goal status: IN PROGRESS  ASSESSMENT:  CLINICAL IMPRESSION: Patient presents with memory deficits secondary to CVA. Pt demonstrates good rehab potential as evidence to verbalizing ways that he can keep his brain active and home, making note of information, and verbalizing understanding of HEP. Pt will continue to benefit from skilled outpatient OT to improve functional independence for ADLs and IADLs.  PERFORMANCE DEFICITS: in functional skills including ADLs, IADLs, coordination, dexterity, proprioception, and decreased knowledge of use of DME, cognitive skills including consciousness, emotional, energy/drive, memory, problem solving, and safety awareness, and psychosocial skills including coping strategies, environmental adaptation, habits, interpersonal interactions, and routines and behaviors.   IMPAIRMENTS: are limiting patient from ADLs, IADLs, rest and sleep, and work.   CO-MORBIDITIES: may have co-morbidities  that affects occupational performance. Patient will benefit from skilled OT to address above impairments and improve overall function.  PLAN:  OT FREQUENCY: 1-2x/week  OT DURATION: 10 weeks  PLANNED INTERVENTIONS: 97168 OT Re-evaluation, 97535 self care/ADL  training, 02889 therapeutic exercise, 97530 therapeutic activity, 97112 neuromuscular re-education, [id number] Cognitive training (first 15 min), 02869 Cognitive training(each additional 15 min), balance training, functional mobility training, visual/perceptual remediation/compensation, psychosocial skills training, energy conservation, coping strategies training, patient/family education, and DME and/or AE instructions  RECOMMENDED OTHER SERVICES: May need recommendations for speech therapy for memory/cognition  CONSULTED AND AGREED WITH PLAN OF CARE: Patient and family member/caregiver  PLAN FOR NEXT SESSION: Word puzzle/ memory activities [employer] Work simulation ( data entry)  Carlo Guevarra, Student-OT 01/26/2024, 1:33 PM

## 2024-01-26 NOTE — Patient Instructions (Signed)
 Energy Conservation- 4 p's  PRIORITY: Decide what tasks are most important and when to complete them. Prioritize urgent tasks over less critical ones.   Grocery shopping first, then come home rest do something light like washing dishes PLAN: Plan your activities in advance to avoid extra trips and gather necessary supplies. This helps in managing the workload and conserving energy.   Example, cooking get all of your ingredients and needed equipment out in one area to avoid unnecessary trips or extra moving back and fourth  PACE: Take your time and avoid rushing. Pace yourself to prevent exhaustion and maintain energy levels.   Do a task, take a break, come back to it later POSITION: Adjust your body position to reduce strain. Sit when possible, and use comfortable tools or equipment to minimize physical exertion. Always be aware of your body position during task, example folding clothes, its okay to have a seat instead of standing and tiring yourself out

## 2024-01-30 NOTE — Telephone Encounter (Signed)
x2 LVM for pt to call back to schedule  

## 2024-02-06 ENCOUNTER — Ambulatory Visit: Attending: Neurology | Admitting: Occupational Therapy

## 2024-02-06 DIAGNOSIS — I69111 Memory deficit following nontraumatic intracerebral hemorrhage: Secondary | ICD-10-CM | POA: Diagnosis present

## 2024-02-06 DIAGNOSIS — M6281 Muscle weakness (generalized): Secondary | ICD-10-CM | POA: Diagnosis present

## 2024-02-06 DIAGNOSIS — R29818 Other symptoms and signs involving the nervous system: Secondary | ICD-10-CM | POA: Insufficient documentation

## 2024-02-06 DIAGNOSIS — R278 Other lack of coordination: Secondary | ICD-10-CM | POA: Diagnosis present

## 2024-02-06 NOTE — Therapy (Addendum)
 OUTPATIENT OCCUPATIONAL THERAPY NEURO TREATMENT  Patient Name: Charles Higgins MRN: [id number] DOB:1980-07-25, 43 y.o., male Today's Date: 02/06/2024  PCP: Kimberly Georgia, MD REFERRING PROVIDER: Rosemarie Eather RAMAN, MD  END OF SESSION:  OT End of Session - 02/06/24 1018     Visit Number 3    Number of Visits 10    Date for Recertification  03/29/24    Authorization Type [employer]BS [id number]    Authorization Time Period VL:? AUTH REQUIRED    OT Start Time 1018    OT Stop Time 1102    OT Time Calculation (min) 44 min    Equipment Utilized During Treatment Computer    Activity Tolerance Patient tolerated treatment well    Behavior During Therapy WFL for tasks assessed/performed           Past Medical History:  Diagnosis Date   Controlled diabetes mellitus type II without complication (HCC) 08/25/2013   Elevated serum creatinine 08/25/2013   Hyperlipidemia 06/23/2011   Hypertension 03/10/2011   Metabolic syndrome 06/28/2015   Umbilical hernia 06/23/2011   Past Surgical History:  Procedure Laterality Date   KNEE SURGERY     High school   NO PAST SURGERIES     Patient Active Problem List   Diagnosis Date Noted   Hypertensive emergency 12/13/2023   Hypokalemia 12/13/2023   Hemorrhagic stroke (HCC) 12/08/2023   Metabolic syndrome 06/28/2015   Controlled diabetes mellitus type II without complication (HCC) 08/25/2013   Elevated serum creatinine 08/25/2013   Hyperlipidemia 06/23/2011   Umbilical hernia 06/23/2011   Hypertension 03/10/2011    ONSET DATE: 12/13/2023  REFERRING DIAG:  Diagnosis  I63.9 (ICD-10-CM) - Cerebrovascular accident (CVA), unspecified mechanism (HCC)    THERAPY DIAG:  Muscle weakness (generalized)  Other lack of coordination  Other symptoms and signs involving the nervous system  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 He feels that his memory is gradually improving. Pt also reported that he plans to return to work November 8th.   Pt accompanied by: Self  PERTINENT HISTORY:  43 y.o. patient with history of  HTN, DM, HLD, and CKD noncompliance with medications presenting with altered mental status and confusion. On arrival to the ED his blood pressure was 204/156.  Reports some word finding difficulty.  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   PRECAUTIONS: Other: safety  WEIGHT BEARING RESTRICTIONS: No  PAIN:  Are you having pain? No  FALLS: Has patient fallen in last 6 months? No  LIVING ENVIRONMENT: Lives with: wife and three children ages 58,7, and 66 Lives in: House Stairs: Yes: Internal: 15 steps to bedroom- removed rails; External: 1; threshold step; rails; none Has following equipment at home: built in shower seat  PLOF: Independent with basic ADLs, Independent with household mobility without device, Independent with community mobility without device, Independent with gait, Independent with transfers, and Leisure: prior to stroke, pt enjoyed bowling, going to the gym, and spending time with friends/family. Pt drives but is having difficulty due to being overstimulated. 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   PATIENT GOALS:   OBJECTIVE:  Note: Objective measures were completed at Evaluation unless otherwise noted.  HAND DOMINANCE: Right  ADLs: Overall ADLs: Independent  Equipment: built in shower seat  IADLs: Shopping: Independent  Light housekeeping: able to help with laundry and other household task; hasn't cut grass due to low energy. Meal Prep: still enjoys cooking independently loves to make pulled pork & banana pudding Community mobility:  drives himself, however recently became overstimulated while driving to the mall last week Medication management: has a hard time managing but is using timers on phone for reminders Financial management: Independent  Handwriting: Not tested ( will assess at next visit)   MOBILITY STATUS: Independent  POSTURE COMMENTS:  No Significant postural limitations Sitting balance: Moves/returns truncal midpoint >2 inches in all planes  ACTIVITY TOLERANCE: Activity tolerance: Since the stroke, patient has experienced low energy and activity tolerance. He is taking more naps throughout the day.   FUNCTIONAL OUTCOME MEASURES: PSFS Began exploring self identified areas of concern including  1. communication style/patience/processing with children- pt reports 5/10 ability/ spouse rates 3/10. 2. Work task-  patient/spouse reports 0/10 3. Driving- not rated at this time but self reported issues with overstimulation.  Trail making Test Test part A: 37.33 seconds; 1 self correct error Test part B: 53.61 seconds; 4 errors  UPPER EXTREMITY ROM:   All WFL  UPPER EXTREMITY MMT:    All WFL  HAND FUNCTION: Grip strength: Right: 73.1,80.0,81.1 lbs; Left: 70.9,81.5, 79.1 lbs Average: R( 78.1); L (77.2)  COORDINATION: 9 Hole Peg test: Right: 30.37 sec; Left: 35.89 sec (dropped 1)  SENSATION: WFL  EDEMA: None   COGNITION: Overall cognitive status: Impaired and easily frustrated; memory loss, unable to recall login information and job details; easily fatigued; limited insight into deficits; self identified word finding difficulty (but improving)   VISION: Subjective report: No concerns Baseline vision: No visual deficits Visual history: N/A  VISION ASSESSMENT: Not tested  Patient has difficulty with following activities due to following visual impairments: N/A  PRAXIS: Impaired: Ideation and Motor planning  OBSERVATIONS: Pt ambulated with no AE and no loss of balance. The pt appears well  kept and groomed with no assistance from family.  Pt had decreased ability to express limitations but emerging awareness of health deficits. Pt able to express that he wants to be healthier.                                                                                                                              TREATMENT DATE: - Therapeutic activities completed for duration as noted below including:  Patient engaged in a work simulation task designed to improve recall of job related tasks and carryover of job responsibilities. Using [employer], the patient successfully entered short applicant profiles and accurately input eligibility statuses into a mock database. The task required the use of working memory and visual scanning to transfer data from a printed reference sheet into the Solicitor. The patient required occasional cueing for accurate data entry but overall demonstrated the ability to complete the task with minimal to no assistance.  The patient appeared focused and motivated throughout the session, showing increased confidence as the task progressed. Fine motor skills and computer navigation were adequate, supporting the patient's ability to interact with the software efficiently. Continued practice with similar tasks is recommended to further enhance accuracy, independence in work related skills, and memory recall to support carryover once the patient returns to work.  PATIENT EDUCATION: Education details: Futures trader, work Barrister's clerk educated: Patient Education method: Programmer, multimedia, Facilities manager, Actor cues, and Verbal cues Education comprehension: verbalized understanding, returned demonstration, verbal cues required, tactile cues required, and needs further education  HOME EXERCISE PROGRAM: 01/26/2024: Energy conservation handout, memory card game  GOALS: Goals reviewed with patient? Yes  SHORT TERM GOALS: Target date: 02/13/2024  Patient will  demonstrate independence with initial B UE/ full body HEP.  Baseline: New to outpt OT  Average Grip Strength: R( 78.1); L (77.2) ; 9 Hole Peg test: Right: 30.37 sec; Left: 35.89 sec (dropped 1) Goal status: IN PROGRESS  2.  Patient will engage in a simulated bowling or light recreational activity for 15 minutes using at least 2 energy conservation strategies (e.g., pacing, seated positioning, rest breaks), with no more than minimal verbal cues, to improve activity tolerance for return to leisure roles. Baseline: Napping daily Goal status: IN PROGRESS  3.  Patient will complete a simulated typing task for 10 minutes with sustained attention and greater than 85% accuracy to support returning to work task and recall of job details. Baseline: TBD Goal status: MET 02/06/2024  4.  Patient will follow a 3-step sequence task of entering data into a spreadsheet/ chart with no more than 2 verbal cues.  Baseline: 0/10 ability to perform job task at eval Goal status: MET 02/06/2024  5.  Patient will independently identify at least 3 coping strategies during daily routines to support family interactions and improve participation in meaningful occupations.  (such as deep breathing, self-talk, journaling, taking walks, scheduled rest breaks) Baseline: family/pt identify increased frustration at times due to overstimulation Goal status: IN PROGRESS 6.  Patient will identify and use external memory aides (calendar, notes, digital reminders) to accurately recall and complete 1- 2 task at home with minimal prompting.  Baseline: limited recall of job task  Goal status: IN PROGRESS  LONG TERM GOALS: Target date: 03/29/2024    Patient will sustain participation in a 30-minute simulated work task (typing, Retail banker, Counsellor documents) with fewer than 3 errors and no rest breaks to increase functional ability to return to work.  Baseline:  0/10 ability to perform job task at eval Goal status: IN  PROGRESS  2.  Patient will demonstrate improved cognitive endurance by completing a dual-task activity such as typing while being on the phone with no more than 1 cue. Baseline: unable  Goal status: IN PROGRESS  3.  Patient will independently demonstrate and complete a 5-step mock work task with 100% accuracy and no cues. Baseline: 0/10 ability to perform job task at eval Goal status: IN PROGRESS  4.  Patient will report at least two-point increase in average PSFS score or at least three-point increase in a single activity score indicating functionally significant improvement given minimum detectable change. Baseline: total score TBD  0/10 ability to perform job task at eval   Goal status: IN PROGRESS  ASSESSMENT:   CLINICAL IMPRESSION: Patient presents with memory deficits secondary to CVA. He demonstrates good rehab potential as evidence to motivation and active participation of given therapeutic task. Pt will continue to  benefit from skilled outpatient OT to improve functional independence for ADLs and IADLs.  PERFORMANCE DEFICITS: in functional skills including ADLs, IADLs, coordination, dexterity, proprioception, and decreased knowledge of use of DME, cognitive skills including consciousness, emotional, energy/drive, memory, problem solving, and safety awareness, and psychosocial skills including coping strategies, environmental adaptation, habits, interpersonal interactions, and routines and behaviors.   IMPAIRMENTS: are limiting patient from ADLs, IADLs, rest and sleep, and work.   CO-MORBIDITIES: may have co-morbidities  that affects occupational performance. Patient will benefit from skilled OT to address above impairments and improve overall function.  PLAN:  OT FREQUENCY: 1-2x/week  OT DURATION: 10 weeks  PLANNED INTERVENTIONS: 97168 OT Re-evaluation, 97535 self care/ADL training, 02889 therapeutic exercise, 97530 therapeutic activity, 97112 neuromuscular re-education, [id number]  Cognitive training (first 15 min), 02869 Cognitive training(each additional 15 min), balance training, functional mobility training, visual/perceptual remediation/compensation, psychosocial skills training, energy conservation, coping strategies training, patient/family education, and DME and/or AE instructions  RECOMMENDED OTHER SERVICES: May need recommendations for speech therapy for memory/cognition  CONSULTED AND AGREED WITH PLAN OF CARE: Patient and family member/caregiver  PLAN FOR NEXT SESSION: Word puzzle/ memory activities- Golf solitaire? Bowling Blaze pods Work simulation task  [employer], Student-OT 02/06/2024, 11:30 AM

## 2024-02-13 ENCOUNTER — Ambulatory Visit: Admitting: Occupational Therapy

## 2024-02-20 ENCOUNTER — Ambulatory Visit: Admitting: Occupational Therapy

## 2024-02-20 DIAGNOSIS — M6281 Muscle weakness (generalized): Secondary | ICD-10-CM | POA: Diagnosis not present

## 2024-02-20 DIAGNOSIS — R29818 Other symptoms and signs involving the nervous system: Secondary | ICD-10-CM

## 2024-02-20 DIAGNOSIS — I69111 Memory deficit following nontraumatic intracerebral hemorrhage: Secondary | ICD-10-CM

## 2024-02-20 DIAGNOSIS — R278 Other lack of coordination: Secondary | ICD-10-CM

## 2024-02-20 NOTE — Therapy (Signed)
 OUTPATIENT OCCUPATIONAL THERAPY NEURO TREATMENT  Patient Name: Charles Higgins MRN: [id number] DOB:Sep 06, 1980, 43 y.o., male Today's Date: 02/20/2024  PCP: Kimberly Georgia, MD REFERRING PROVIDER: Rosemarie Eather RAMAN, MD  END OF SESSION:  OT End of Session - 02/20/24 1019     Visit Number 4    Number of Visits 10    Date for Recertification  03/29/24    Authorization Type [id number]    Authorization Time Period VL:? AUTH REQUIRED    OT Start Time 1015    OT Stop Time 1058    OT Time Calculation (min) 43 min    Equipment Utilized During Treatment Laundry    Activity Tolerance Patient tolerated treatment well    Behavior During Therapy WFL for tasks assessed/performed           Past Medical History:  Diagnosis Date   Controlled diabetes mellitus type II without complication (HCC) 08/25/2013   Elevated serum creatinine 08/25/2013   Hyperlipidemia 06/23/2011   Hypertension 03/10/2011   Metabolic syndrome 06/28/2015   Umbilical hernia 06/23/2011   Past Surgical History:  Procedure Laterality Date   KNEE SURGERY     High school   NO PAST SURGERIES     Patient Active Problem List   Diagnosis Date Noted   Hypertensive emergency 12/13/2023   Hypokalemia 12/13/2023   Hemorrhagic stroke (HCC) 12/08/2023   Metabolic syndrome 06/28/2015   Controlled diabetes mellitus type II without complication (HCC) 08/25/2013   Elevated serum creatinine 08/25/2013   Hyperlipidemia 06/23/2011   Umbilical hernia 06/23/2011   Hypertension 03/10/2011    ONSET DATE: 12/13/2023  REFERRING DIAG:  Diagnosis  I63.9 (ICD-10-CM) - Cerebrovascular accident (CVA), unspecified mechanism (HCC)    THERAPY DIAG:  No diagnosis found.  Rationale for Evaluation and Treatment: Rehabilitation  SUBJECTIVE:   SUBJECTIVE STATEMENT: The patient reports that things have been going well, feeling more alert and focused with an improved ability to understand details. He has been able to recall login information for  work, though some blurriness remains regarding specific job details. The patient is scheduled to return to work on November 10th, pending clearance from his PCP.   Pt accompanied by: Self  PERTINENT HISTORY:  43 y.o. patient with history of  HTN, DM, HLD, and CKD noncompliance with medications presenting with altered mental status and confusion. On arrival to the ED his blood pressure was 204/156.  Reports some word finding difficulty.  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   PRECAUTIONS: Other: safety  WEIGHT BEARING RESTRICTIONS: No  PAIN:  Are you having pain? No  FALLS: Has patient fallen in last 6 months? No  LIVING ENVIRONMENT: Lives with: wife and three children ages 74,7, and 47 Lives in: House Stairs: Yes: Internal: 15 steps to bedroom- removed rails; External: 1; threshold step; rails; none Has following equipment at home: built in shower seat  PLOF: Independent with basic ADLs, Independent with household mobility without device, Independent with community mobility without device, Independent with gait, Independent with transfers, and Leisure: prior to stroke, pt enjoyed bowling, going to the gym, and spending time with friends/family. Pt drives but is having difficulty due to being overstimulated. 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   PATIENT GOALS:   OBJECTIVE:  Note: Objective measures were completed at Evaluation unless otherwise noted.  HAND DOMINANCE: Right  ADLs: Overall ADLs: Independent Equipment: built in shower  seat  IADLs: Shopping: Independent  Light housekeeping: able to help with laundry and other household task; hasn't cut grass due to low energy. Meal Prep: still enjoys cooking independently loves to make pulled pork & banana pudding Community mobility: drives himself, however recently became  overstimulated while driving to the mall last week Medication management: has a hard time managing but is using timers on phone for reminders Financial management: Independent  Handwriting: WNL   MOBILITY STATUS: Independent  POSTURE COMMENTS:  No Significant postural limitations Sitting balance: Moves/returns truncal midpoint >2 inches in all planes  ACTIVITY TOLERANCE: Activity tolerance: Since the stroke, patient has experienced low energy and activity tolerance. He is taking more naps throughout the day.   FUNCTIONAL OUTCOME MEASURES: PSFS Began exploring self identified areas of concern including  1. communication style/patience/processing with children- pt reports 5/10 ability/ spouse rates 3/10. 2. Work task-  patient/spouse reports 0/10 3. Driving- not rated at this time but self reported issues with overstimulation.  Trail making Test Test part A: 37.33 seconds; 1 self correct error Test part B: 53.61 seconds; 4 errors  UPPER EXTREMITY ROM:   All WFL  UPPER EXTREMITY MMT:    All WFL  HAND FUNCTION: Grip strength: Right: 73.1,80.0,81.1 lbs; Left: 70.9,81.5, 79.1 lbs Average: R( 78.1); L (77.2)  COORDINATION: 9 Hole Peg test: Right: 30.37 sec; Left: 35.89 sec (dropped 1)  SENSATION: WFL  EDEMA: None   COGNITION: Overall cognitive status: Impaired and easily frustrated; memory loss, unable to recall login information and job details; easily fatigued; limited insight into deficits; self identified word finding difficulty (but improving)   VISION: Subjective report: No concerns Baseline vision: No visual deficits Visual history: N/A  VISION ASSESSMENT: Not tested  Patient has difficulty with following activities due to following visual impairments: N/A  PRAXIS: Impaired: Ideation and Motor planning  OBSERVATIONS: Pt ambulated with no AE and no loss of balance. The pt appears well kept and groomed with no assistance from family.  Pt had decreased ability  to express limitations but emerging awareness of health deficits. Pt able to express that he wants to be healthier.                                                                                                                              TREATMENT : - Self-care/home management completed for duration as noted below including:  Patient was educated on memory compensation strategies to support cognitive functioning and independence in daily activities. The following strategies were introduced and discussed: WARM strategy (Write it down, Associate it, Repeat it, Make a mental picture) to enhance memory encoding and retrieval. Use of a Memory Notebook with organized sections (calendar, contacts, medications, daily journal) to support structured recall. Implementation of daily schedules and calendar use for routine planning. Use of medication organizers and assistance with setup as needed. Environmental modifications including a basket/pegboard system near the exit for essential items and reminders. Placement of sticky notes  in task-relevant locations to cue memory. Use of alarms, timers, and reminder apps for task initiation and completion. Introduction to voice recording tools for capturing and reviewing important information.   At this time, the patient verbalized that he has not been consistently using strategies as recommended. However, he has been encouraged to write down one or two job-related tasks or details each day and bring these notes to the next session for review, with the goal of improving recall and facilitating carryover of job-specific skills for transition back to work.  - Therapeutic activities completed for duration as noted below including:  OT educated pt on table top play of Golf Solitaire for RUE and LUE to address scanning and locating of items, processing, item/pattern recognition, and endurance/stamina. Pt required minimal cues for proper play.    The patient engaged in  a bowling simulation task designed to sustain endurance while incorporating the 4 P's of energy conservation (pacing, positioning, etc.) as needed. While standing, the patient used both upper extremities to roll a ball and knock over as many cones as possible, tolerating the activity well. The patient also reported noticeable improvements in endurance, noting he no longer requires daily naps, can manage multiple tasks throughout the day, and can go on walks without the previous level of fatigue.   Endurance was further sustained during a functional task where the patient stood while folding clothing and transferring items to another bin. No rest breaks or changes in positioning, such as being seated, were needed throughout either activity.   PATIENT EDUCATION: Education details: WARM strategies (encouraged to write specific job details daily), Manufacturing systems engineer Person educated: Patient Education method: Programmer, multimedia, Facilities manager, Actor cues, Verbal cues, and Handouts Education comprehension: verbalized understanding, returned demonstration, verbal cues required, tactile cues required, and needs further education  HOME EXERCISE PROGRAM: 01/26/2024: Energy conservation handout, memory card game 02/20/2024: WARM strategies (encouraged to write specific job details daily & bring into next session), Golf solitaire  GOALS: Goals reviewed with patient? Yes  SHORT TERM GOALS: Target date: 02/13/2024  Patient will demonstrate independence with initial B UE/ full body HEP.  Baseline: New to outpt OT  Average Grip Strength: R( 78.1); L (77.2) ; 9 Hole Peg test: Right: 30.37 sec; Left: 35.89 sec (dropped 1) Goal status: IN PROGRESS  2.  Patient will engage in a simulated bowling or light recreational activity for 15 minutes using at least 2 energy conservation strategies (e.g., pacing, seated positioning, rest breaks), with no more than minimal verbal cues, to improve activity tolerance for return to  leisure roles. Baseline: Napping daily Goal status: MET- 02/20/2024  3.  Patient will complete a simulated typing task for 10 minutes with sustained attention and greater than 85% accuracy to support returning to work task and recall of job details. Baseline: TBD Goal status: MET 02/06/2024  4.  Patient will follow a 3-step sequence task of entering data into a spreadsheet/ chart with no more than 2 verbal cues.  Baseline: 0/10 ability to perform job task at eval Goal status: MET 02/06/2024  5.  Patient will independently identify at least 3 coping strategies during daily routines to support family interactions and improve participation in meaningful occupations.  (such as deep breathing, self-talk, journaling, taking walks, scheduled rest breaks) Baseline: family/pt identify increased frustration at times due to overstimulation Goal status: IN PROGRESS  6.  Patient will identify and use external memory aides (calendar, notes, digital reminders) to accurately recall and complete 1- 2 task at home with minimal  prompting.  Baseline: limited recall of job task  Goal status: IN PROGRESS  LONG TERM GOALS: Target date: 03/29/2024   Patient will sustain participation in a 30-minute simulated work task (typing, Retail banker, Counsellor documents) with fewer than 3 errors and no rest breaks to increase functional ability to return to work.  Baseline:  0/10 ability to perform job task at eval Goal status: MET-02/06/2024  2.  Patient will demonstrate improved cognitive endurance by completing a dual-task activity such as typing while being on the phone with no more than 1 cue. Baseline: unable  Goal status: IN PROGRESS  3.  Patient will independently demonstrate and complete a 5-step mock work task with 100% accuracy and no cues. Baseline: 0/10 ability to perform job task at eval Goal status: IN PROGRESS  4.  Patient will report at least two-point increase in average PSFS score or at  least three-point increase in a single activity score indicating functionally significant improvement given minimum detectable change. Baseline: total score TBD  0/10 ability to perform job task at eval   Goal status: IN PROGRESS  ASSESSMENT:   CLINICAL IMPRESSION: Patient presents with memory deficits secondary to CVA. He demonstrates good rehab potential as evidence to motivation and active participation of given therapeutic task. However, he has not yet been able to fully recall specific job details and is scheduled to return to work within the next month. The patient acknowledged non-compliance with using memory strategies but has expressed willingness to begin using them consistently. Pt will continue to benefit from skilled outpatient OT to improve functional independence for ADLs and IADLs.  PERFORMANCE DEFICITS: in functional skills including ADLs, IADLs, coordination, dexterity, proprioception, and decreased knowledge of use of DME, cognitive skills including consciousness, emotional, energy/drive, memory, problem solving, and safety awareness, and psychosocial skills including coping strategies, environmental adaptation, habits, interpersonal interactions, and routines and behaviors.   IMPAIRMENTS: are limiting patient from ADLs, IADLs, rest and sleep, and work.   CO-MORBIDITIES: may have co-morbidities  that affects occupational performance. Patient will benefit from skilled OT to address above impairments and improve overall function.  PLAN:  OT FREQUENCY: 1-2x/week  OT DURATION: 10 weeks  PLANNED INTERVENTIONS: 97168 OT Re-evaluation, 97535 self care/ADL training, 02889 therapeutic exercise, 97530 therapeutic activity, 97112 neuromuscular re-education, [id number] Cognitive training (first 15 min), 02869 Cognitive training(each additional 15 min), balance training, functional mobility training, visual/perceptual remediation/compensation, psychosocial skills training, energy conservation,  coping strategies training, patient/family education, and DME and/or AE instructions  RECOMMENDED OTHER SERVICES: May need recommendations for speech therapy for memory/cognition  CONSULTED AND AGREED WITH PLAN OF CARE: Patient and family member/caregiver  PLAN FOR NEXT SESSION: Word puzzle [employer] pods Work simulation task- review what pt brings into next session  Shaima Sardinas, Student-OT 02/20/2024, 11:05 AM

## 2024-02-20 NOTE — Patient Instructions (Addendum)

## 2024-02-27 ENCOUNTER — Ambulatory Visit: Admitting: Occupational Therapy

## 2024-02-27 DIAGNOSIS — I69111 Memory deficit following nontraumatic intracerebral hemorrhage: Secondary | ICD-10-CM

## 2024-02-27 DIAGNOSIS — M6281 Muscle weakness (generalized): Secondary | ICD-10-CM

## 2024-02-27 DIAGNOSIS — R29818 Other symptoms and signs involving the nervous system: Secondary | ICD-10-CM

## 2024-02-27 DIAGNOSIS — R278 Other lack of coordination: Secondary | ICD-10-CM

## 2024-02-27 NOTE — Therapy (Addendum)
 OUTPATIENT OCCUPATIONAL THERAPY NEURO TREATMENT  Patient Name: Charles Higgins MRN: [id number] DOB:05-13-1980, 43 y.o., male Today's Date: 02/27/2024  PCP: Kimberly Georgia, MD REFERRING PROVIDER: Rosemarie Eather RAMAN, MD  END OF SESSION:  OT End of Session - 02/27/24 1018     Visit Number 5    Number of Visits 10    Date for Recertification  03/29/24    Authorization Type BCBS [id number]    Authorization Time Period VL:? AUTH REQUIRED    OT Start Time 1016    OT Stop Time 1101    OT Time Calculation (min) 45 min    Activity Tolerance Patient tolerated treatment well    Behavior During Therapy WFL for tasks assessed/performed          Past Medical History:  Diagnosis Date   Controlled diabetes mellitus type II without complication (HCC) 08/25/2013   Elevated serum creatinine 08/25/2013   Hyperlipidemia 06/23/2011   Hypertension 03/10/2011   Metabolic syndrome 06/28/2015   Umbilical hernia 06/23/2011   Past Surgical History:  Procedure Laterality Date   KNEE SURGERY     High school   NO PAST SURGERIES     Patient Active Problem List   Diagnosis Date Noted   Hypertensive emergency 12/13/2023   Hypokalemia 12/13/2023   Hemorrhagic stroke (HCC) 12/08/2023   Metabolic syndrome 06/28/2015   Controlled diabetes mellitus type II without complication (HCC) 08/25/2013   Elevated serum creatinine 08/25/2013   Hyperlipidemia 06/23/2011   Umbilical hernia 06/23/2011   Hypertension 03/10/2011   ONSET DATE: 12/13/2023  REFERRING DIAG:  Diagnosis  I63.9 (ICD-10-CM) - Cerebrovascular accident (CVA), unspecified mechanism (HCC)   THERAPY DIAG:  Muscle weakness (generalized)  Other lack of coordination  Other symptoms and signs involving the nervous system  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  Pt accompanied by: Self  PERTINENT HISTORY:  43 y.o. patient with history of  HTN, DM, HLD, and CKD noncompliance with medications presenting with altered mental status and confusion. On arrival to the ED his blood pressure was 204/156.  Reports some word finding difficulty.  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   PRECAUTIONS: Other: safety  WEIGHT BEARING RESTRICTIONS: No  PAIN:  Are you having pain? No  FALLS: Has patient fallen in last 6 months? No  LIVING ENVIRONMENT: Lives with: wife and three children ages 34,7, and 1 Lives in: House Stairs: Yes: Internal: 15 steps to bedroom- removed rails; External: 1; threshold step; rails; none Has following equipment at home: built in shower seat  PLOF: Independent with basic ADLs, Independent with household mobility without device, Independent with community mobility without device, Independent with gait, Independent with transfers, and Leisure: prior to stroke, pt enjoyed bowling, going to the gym, and spending time with friends/family. Pt drives but is having difficulty due to being overstimulated. 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   PATIENT GOALS:   OBJECTIVE:  Note: Objective measures were completed at Evaluation unless otherwise noted.  HAND DOMINANCE: Right  ADLs: Overall ADLs: Independent Equipment: built in shower seat  IADLs: Shopping: Independent  Light housekeeping: able to help with laundry and  other household task; hasn't cut grass due to low energy. Meal Prep: still enjoys cooking independently loves to make pulled pork & banana pudding Community mobility: drives himself, however recently became overstimulated while driving [place] the  [place]all last week Medication management: has a hard time managing but is using timers on phone for reminders Financial management: Independent  Handwriting: WNL   MOBILITY STATUS: Independent  POSTURE COMMENTS:  No Significant postural limitations Sitting balance: Moves/returns truncal midpoint >2 inches in all planes  ACTIVITY TOLERANCE: Activity tolerance: Since the stroke, patient has experienced low energy and activity tolerance. He is taking more naps throughout the day.   FUNCTIONAL OUTCOME MEASURES: PSFS Began exploring self identified areas of concern including  1. communication style/patience/processing with children- pt reports 5/10 ability/ spouse rates 3/10. 2. Work task-  patient/spouse reports 0/10 3. Driving- not rated at this time but self reported issues with overstimulation.  Trail making Test Test part A: 37.33 seconds; 1 self correct error Test part B: 53.61 seconds; 4 errors  UPPER EXTREMITY ROM:   All WFL  UPPER EXTREMITY MMT:    All WFL  HAND FUNCTION: Grip strength: Right: 73.1,80.0,81.1 lbs; Left: 70.9,81.5, 79.1 lbs Average: R( 78.1); L (77.2)  COORDINATION: 9 Hole Peg test: Right: 30.37 sec; Left: 35.89 sec (dropped 1)  SENSATION: WFL  EDEMA: None   COGNITION: Overall cognitive status: Impaired and easily frustrated; memory loss, unable to recall login information and job details; easily fatigued; limited insight into deficits; self identified word finding difficulty (but improving)   VISION: Subjective report: No concerns Baseline vision: No visual deficits Visual history: N/A  VISION ASSESSMENT: Not tested  Patient has difficulty with following activities due to following visual impairments: N/A  PRAXIS: Impaired: Ideation and Motor planning  OBSERVATIONS: Pt ambulated with no AE and no loss of balance. The pt appears well kept and groomed with no assistance from family.  Pt had decreased ability to express limitations but emerging  awareness of health deficits. Pt able to express that he wants to be healthier.                                                                                                                              TREATMENT : - Therapeutic activities completed for duration as noted below including:   At the patient's initial evaluation, he reported feeling overstimulated while driving. Therefore, he engaged in an activity with distractions to promote attention, focus, and tolerance to sensory input in preparation for safe driving. OT placed 4 BlazePods in front of patient and had patient tap pods with palm of hand and bimanually as pods lit up using OT Distraction and Scanning mode forreaction time, processing, and endurance/stamina.   Hits were as follows:  RUE and LUE: 99 hits for 2 min duration and .88 sec reaction time RUE and LUE: 107 hits for 2 min duration and .93 sec reaction time   The patient reports noticing improvement, stating  that he no longer becomes overwhelmed while driving or when in large groups of people. However, he continues to refrain from driving at night at this time.  - Self-care/home management completed for duration as noted below including:   Patient reports that he is scheduled to meet with his PCP on 03/05/2024 to determine if he is medically cleared to return to work in the upcoming weeks. He also notes that since his last OT visit, he has been able to recall work related procedures, such as the login process, including receiving an access code on his phone and for login to work software.  Additionally, the patient was encouraged to use memory strategies such as writing information down, repeating it aloud, creating mental images, and making associations to enhance recall of job-related tasks. He was provided with a piece of blank paper and prompted to visualize being at work, mentally rehearsing his activities from the start to the end of the workday. 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 Each day, he works with [employer], such as Community Education Officer (he was unable to recall others at this time). He opens the relevant files, which list employees, and reviews the information to determine if any updates to benefits, such as dental or vision, are needed, or if an employee is no longer employed, in which case he notes that they are no longer eligible for work benefits. He typically reviews 20-30 files per day and sends the compiled information to his supervisor for review. Pt has been encouraged to take this sheet of paper home with this written information for continued review and potential of more recall in relation to his job details.  Pt verbalized that this is the first time since his CVA that he has been able to recall this many job details. He was encouraged to continue using strategies such as writing information down, repeating it aloud, making associations, and creating mental images to support continued recall of job related tasks. He feels that once he is in front of the computer with this information, he would be able to recall even more. Therefore, in the next session, OT will set up a simulated work environment using the provided information to practice carryover of task and functional work skills.  PATIENT EDUCATION: Education details: WARM strategies (encouraged to use strategies daily for recall of specific job details ) Person educated: Patient Education method: Programmer, Multimedia, Facilities Manager, Actor cues, Verbal cues, and Handouts Education comprehension: verbalized understanding, returned demonstration, verbal cues required, tactile cues required, and needs further education  HOME EXERCISE PROGRAM: 01/26/2024: Energy conservation handout,  memory card game 02/20/2024: WARM strategies (encouraged to write specific job details daily & bring into next session)  GOALS: Goals reviewed with patient? Yes  SHORT TERM GOALS: Target date: 02/13/2024  Patient will demonstrate independence with initial B UE/ full body HEP.  Baseline: New to outpt OT  Average Grip Strength: R( 78.1); L (77.2) ; 9 Hole Peg test: Right: 30.37 sec; Left: 35.89 sec (dropped 1) Goal status: IN PROGRESS  2.  Patient will engage in a simulated bowling or light recreational activity for 15 minutes using at least 2 energy conservation strategies (e.g., pacing, seated positioning, rest breaks), with no more than minimal verbal cues, to improve activity tolerance for  return to leisure roles. Baseline: Napping daily Goal status: MET- 02/20/2024  3.  Patient will complete a simulated typing task for 10 minutes with sustained attention and greater than 85% accuracy to support returning to work task and recall of job details. Baseline: TBD Goal status: MET 02/06/2024  4.  Patient will follow a 3-step sequence task of entering data into a spreadsheet/ chart with no more than 2 verbal cues.  Baseline: 0/10 ability to perform job task at eval Goal status: MET 02/06/2024  5.  Patient will independently identify at least 3 coping strategies during daily routines to support family interactions and improve participation in meaningful occupations.  (such as deep breathing, self-talk, journaling, taking walks, scheduled rest breaks) Baseline: family/pt identify increased frustration at times due to overstimulation Goal status: MET-02/27/24 Pt enjoys reading, going on walks, and taking rest breaks as needed.  6.  Patient will identify and use external memory aides (calendar, notes, digital reminders) to accurately recall and complete 1- 2 task at home with minimal prompting.  Baseline: limited recall of job task  Goal status: MET-02/27/24 Uses a calendar and takes notes for  better recall of day to day task  LONG TERM GOALS: Target date: 03/29/2024   Patient will sustain participation in a 30-minute simulated work task (typing, retail banker, counsellor documents) with fewer than 3 errors and no rest breaks to increase functional ability to return to work.  Baseline:  0/10 ability to perform job task at eval Goal status: MET-02/06/2024  2.  Patient will demonstrate improved cognitive endurance by completing a dual-task activity such as typing while being on the phone with no more than 1 cue. Baseline: unable  Goal status: IN PROGRESS  3.  Patient will independently demonstrate and complete a 5-step mock work task with 100% accuracy and no cues. Baseline: 0/10 ability to perform job task at eval Goal status: IN PROGRESS  4.  Patient will report at least three-point increase in a single activity score indicating functionally significant improvement given minimum detectable change. Baseline:  0/10 ability to perform job task at eval;communication style/patience/processing with children- pt reports 5/10 ability/ spouse rates 3/10.   Goal status: IN PROGRESS  ASSESSMENT:   CLINICAL IMPRESSION: Patient presents with memory deficits secondary to CVA. He demonstrates good rehab potential as evidence to motivation and active participation of given therapeutic task. He has not yet been able to fully recall specific job details and is scheduled to return to work within the next month. However, pt was able to recall more job details at today's session than he has been able to recall before. He will continue to benefit from skilled outpatient OT to improve functional independence for ADLs and IADLs.  PERFORMANCE DEFICITS: in functional skills including ADLs, IADLs, coordination, dexterity, proprioception, and decreased knowledge of use of DME, cognitive skills including consciousness, emotional, energy/drive, memory, problem solving, and safety awareness, and  psychosocial skills including coping strategies, environmental adaptation, habits, interpersonal interactions, and routines and behaviors.   IMPAIRMENTS: are limiting patient from ADLs, IADLs, rest and sleep, and work.   CO-MORBIDITIES: may have co-morbidities  that affects occupational performance. Patient will benefit from skilled OT to address above impairments and improve overall function.  PLAN:  OT FREQUENCY: 1-2x/week  OT DURATION: 10 weeks  PLANNED INTERVENTIONS: 97168 OT Re-evaluation, 97535 self care/ADL training, 02889 therapeutic exercise, 97530 therapeutic activity, 97112 neuromuscular re-education, [id number] Cognitive training (first 15 min), [id number] Cognitive training(each additional 15 min), balance training, functional mobility training, visual/perceptual  remediation/compensation, psychosocial skills training, energy conservation, coping strategies training, patient/family education, and DME and/or AE instructions  RECOMMENDED OTHER SERVICES: May need recommendations for speech therapy for memory/cognition  CONSULTED AND AGREED WITH PLAN OF CARE: Patient and family member/caregiver  PLAN FOR NEXT SESSION: Work simulation task- spreadsheet, entry of data Issue putty for isolated finger movements for continued typing at work PSFS - update goal  Kloey Cazarez, Student-OT 02/27/2024, 11:18 AM

## 2024-03-05 ENCOUNTER — Ambulatory Visit: Attending: Neurology | Admitting: Occupational Therapy

## 2024-03-05 DIAGNOSIS — R41844 Frontal lobe and executive function deficit: Secondary | ICD-10-CM | POA: Diagnosis present

## 2024-03-05 DIAGNOSIS — R278 Other lack of coordination: Secondary | ICD-10-CM | POA: Insufficient documentation

## 2024-03-05 DIAGNOSIS — R29818 Other symptoms and signs involving the nervous system: Secondary | ICD-10-CM | POA: Diagnosis present

## 2024-03-05 DIAGNOSIS — M6281 Muscle weakness (generalized): Secondary | ICD-10-CM | POA: Diagnosis present

## 2024-03-05 DIAGNOSIS — I69111 Memory deficit following nontraumatic intracerebral hemorrhage: Secondary | ICD-10-CM | POA: Insufficient documentation

## 2024-03-05 NOTE — Therapy (Unsigned)
 OUTPATIENT OCCUPATIONAL THERAPY NEURO TREATMENT  Patient Name: Charles Higgins MRN: [id number] DOB:[birth year], 43 y.o., male Today's Date: 03/05/2024  PCP: Kimberly Georgia, MD REFERRING PROVIDER: Rosemarie Eather RAMAN, MD  END OF SESSION:  OT End of Session - 03/05/24 1022     Visit Number 6    Number of Visits 10    Date for Recertification  03/29/24    Authorization Type [employer]BS [id number]    Authorization Time Period VL:? AUTH REQUIRED    OT Start Time 1019    OT Stop Time 1100    OT Time Calculation (min) 41 min    Activity Tolerance Patient tolerated treatment well    Behavior During Therapy WFL for tasks assessed/performed          Past Medical History:  Diagnosis Date   Controlled diabetes mellitus type II without complication (HCC) 08/25/2013   Elevated serum creatinine 08/25/2013   Hyperlipidemia 06/23/2011   Hypertension 03/10/2011   Metabolic syndrome 06/28/2015   Umbilical hernia 06/23/2011   Past Surgical History:  Procedure Laterality Date   KNEE SURGERY     High school   NO PAST SURGERIES     Patient Active Problem List   Diagnosis Date Noted   Hypertensive emergency 12/13/2023   Hypokalemia 12/13/2023   Hemorrhagic stroke (HCC) 12/08/2023   Metabolic syndrome 06/28/2015   Controlled diabetes mellitus type II without complication (HCC) 08/25/2013   Elevated serum creatinine 08/25/2013   Hyperlipidemia 06/23/2011   Umbilical hernia 06/23/2011   Hypertension 03/10/2011   ONSET DATE: 12/13/2023  REFERRING DIAG:  Diagnosis  I63.9 (ICD-10-CM) - Cerebrovascular accident (CVA), unspecified mechanism (HCC)   THERAPY DIAG:  Muscle weakness (generalized)  Other lack of coordination  Other symptoms and signs involving the nervous system  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 He is asking if any modifications/recommendations are needed. He will have someone with him to assist him through the process initially.   Pt accompanied by: Self  PERTINENT HISTORY:  43 y.o. patient with history of  HTN, DM, HLD, and CKD noncompliance with medications presenting with altered mental status and confusion. On arrival to the ED his blood pressure was 204/156.  Reports some word finding difficulty.  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   PRECAUTIONS: Other: safety  WEIGHT BEARING RESTRICTIONS: No  PAIN:  Are you having pain? No  FALLS: Has patient fallen in last 6 months? No  LIVING ENVIRONMENT: Lives with: wife and three children ages 69,7, and 76 Lives in: House Stairs: Yes: Internal: 15 steps to bedroom- removed rails; External: 1; threshold step; rails; none Has following equipment at home: built in shower seat  PLOF: Independent with basic ADLs, Independent with household mobility without device, Independent with community mobility without device, Independent with gait, Independent with transfers, and Leisure: prior to stroke, pt enjoyed bowling, going to the gym, and spending time with friends/family. Pt drives but is having difficulty due to being overstimulated. 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   PATIENT GOALS:   OBJECTIVE:  Note: Objective measures were completed at Evaluation unless  otherwise noted.  HAND DOMINANCE: Right  ADLs: Overall ADLs: Independent Equipment: built in shower seat  IADLs: Shopping: Independent  Light housekeeping: able to help with laundry and other household task; hasn't cut grass due to low energy. Meal Prep: still enjoys cooking  independently loves to make pulled pork & banana pudding Community mobility: drives himself, however recently became overstimulated while driving to the mall last week Medication management: has a hard time managing but is using timers on phone for reminders Financial management: Independent  Handwriting: WNL   MOBILITY STATUS: Independent  POSTURE COMMENTS:  No Significant postural limitations Sitting balance: Moves/returns truncal midpoint >2 inches in all planes  ACTIVITY TOLERANCE: Activity tolerance: Since the stroke, patient has experienced low energy and activity tolerance. He is taking more naps throughout the day.   FUNCTIONAL OUTCOME MEASURES: PSFS Began exploring self identified areas of concern including  1. communication style/patience/processing with children- pt reports 5/10 ability/ spouse rates 3/10. 2. Work task-  patient/spouse reports 0/10 3. Driving- not rated at this time but self reported issues with overstimulation.  03/05/2024: 8.0 total score    Trail making Test Test part A: 37.33 seconds; 1 self correct error Test part B: 53.61 seconds; 4 errors  UPPER EXTREMITY ROM:   All WFL  UPPER EXTREMITY MMT:    All WFL  HAND FUNCTION: Grip strength: Right: 73.1,80.0,81.1 lbs; Left: 70.9,81.5, 79.1 lbs Average: R( 78.1); L (77.2)  COORDINATION: 9 Hole Peg test: Right: 30.37 sec; Left: 35.89 sec (dropped 1)  SENSATION: WFL  EDEMA: None   COGNITION: Overall cognitive status: Impaired and easily frustrated; memory loss, unable to recall login information and job details; easily fatigued; limited insight into deficits; self identified word finding difficulty (but improving)   VISION: Subjective report: No concerns Baseline vision: No visual deficits Visual history: N/A  VISION ASSESSMENT: Not tested  Patient has difficulty with following activities due to following visual impairments: N/A  PRAXIS: Impaired: Ideation and Motor  planning  OBSERVATIONS: Pt ambulated with no AE and no loss of balance. The pt appears well kept and groomed with no assistance from family.  Pt had decreased ability to express limitations but emerging awareness of health deficits. Pt able to express that he wants to be healthier.                                                                                                                              TREATMENT : - Therapeutic activities completed for duration as noted below including:   Objective measures assessed as noted in Goals section to determine progression towards goals. Therapist reviewed goals with patient and updated patient progression. Patient engaged in color Sudoku activity using bilaterally to address attention, problem-solving, deductive reasoning, visual scanning, tolerance, and task persistence, and spatial organization. Required occasional redirection to check for duplicates and minimal cueing for proper play.   Pt participated in 4 games of Uzzle at level(s) 1, 2, and 3 using  RUE and LUE for eye-hand coordination and cognition by matching game pieces to visual patterns. Pt demonstrating ability to solve level(s) 1, 2, and 3. OT educated pt on return to work recommendations including starting with half days to allow pt to have adequate rest and to reduce physical stressors, which could lead to further complications.   PATIENT EDUCATION: Education details: goal progression, work recommendations, activities as noted above Person educated: Patient Education method: Programmer, Multimedia, Facilities Manager, Actor cues, and Verbal cues Education comprehension: verbalized understanding, returned demonstration, verbal cues required, tactile cues required, and needs further education  HOME EXERCISE PROGRAM: 01/26/2024: Energy conservation handout, memory card game 02/20/2024: WARM strategies (encouraged to write specific job details daily & bring into next session)  GOALS: Goals reviewed  with patient? Yes  SHORT TERM GOALS:  MET 6/6 goals  LONG TERM GOALS: Target date: 03/29/2024   Patient will sustain participation in a 30-minute simulated work task (typing, retail banker, counsellor documents) with fewer than 3 errors and no rest breaks to increase functional ability to return to work.  Baseline:  0/10 ability to perform job task at eval Goal status: MET-02/06/2024  2.  Patient will demonstrate improved cognitive endurance by completing a dual-task activity such as typing while being on the phone with no more than 1 cue. Baseline: unable  Goal status: IN PROGRESS  3.  Patient will independently demonstrate and complete a 5-step mock work task with 100% accuracy and no cues. Baseline: 0/10 ability to perform job task at eval Goal status: IN PROGRESS  4.  Patient will report at least three-point increase in a single activity score indicating functionally significant improvement given minimum detectable change. Baseline:  0/10 ability to perform job task at eval;communication style/patience/processing with children- pt reports 5/10 ability/ spouse rates 3/10.   Goal status: MET  ASSESSMENT:   CLINICAL IMPRESSION: Patient presents with memory deficits secondary to CVA. He would benefit from half days at work upon initial return given brain injury and likelihood for fatiguing/need for rest as part of rehabilitation. Pt will likely benefit from being in contextual work environment due to saliency. Additional OT would be beneficial for pt to reach max rehab potential and following return to work to address specific, work-related limitations that arise.    PERFORMANCE DEFICITS: in functional skills including ADLs, IADLs, coordination, dexterity, proprioception, and decreased knowledge of use of DME, cognitive skills including consciousness, emotional, energy/drive, memory, problem solving, and safety awareness, and psychosocial skills including coping strategies,  environmental adaptation, habits, interpersonal interactions, and routines and behaviors.   IMPAIRMENTS: are limiting patient from ADLs, IADLs, rest and sleep, and work.   CO-MORBIDITIES: may have co-morbidities  that affects occupational performance. Patient will benefit from skilled OT to address above impairments and improve overall function.  PLAN:  OT FREQUENCY: 1-2x/week  OT DURATION: 10 weeks  PLANNED INTERVENTIONS: 97168 OT Re-evaluation, 97535 self care/ADL training, 02889 therapeutic exercise, 97530 therapeutic activity, 97112 neuromuscular re-education, [id number] Cognitive training (first 15 min), 02869 Cognitive training(each additional 15 min), balance training, functional mobility training, visual/perceptual remediation/compensation, psychosocial skills training, energy conservation, coping strategies training, patient/family education, and DME and/or AE instructions  RECOMMENDED OTHER SERVICES: May need recommendations for speech therapy for memory/cognition  CONSULTED AND AGREED WITH PLAN OF CARE: Patient and family member/caregiver  PLAN FOR NEXT SESSION: Work simulation task- spreadsheet, entry of data Issue putty for isolated finger movements for continued typing at work  [employer], OT 03/05/2024, 10:23 AM

## 2024-03-12 ENCOUNTER — Encounter: Payer: Self-pay | Admitting: Occupational Therapy

## 2024-03-12 ENCOUNTER — Ambulatory Visit: Admitting: Occupational Therapy

## 2024-03-12 DIAGNOSIS — M6281 Muscle weakness (generalized): Secondary | ICD-10-CM

## 2024-03-12 NOTE — Therapy (Signed)
 OCCUPATIONAL THERAPY DISCHARGE SUMMARY  Visits from Start of Care: 6  Pt contacted clinic and requests d/c as he is returning to work.  Current functional level related to goals / functional outcomes: Unable to assess goals.   Remaining deficits: Ongoing impairments, see tx notes for additional details.   Education / Equipment: Pt has some needed materials and education. See tx notes for more details.    Patient goals were unable to be assessed. Patient is being discharged due to the patient's request.   If additional OT services are recommended, a new OT referral will be required.

## 2024-03-19 ENCOUNTER — Encounter: Admitting: Occupational Therapy

## 2024-03-26 ENCOUNTER — Encounter: Admitting: Occupational Therapy

## 2024-10-09 ENCOUNTER — Ambulatory Visit: Admitting: Adult Health
